# Patient Record
Sex: Female | Born: 1980 | Hispanic: No | Marital: Married | Smoking: Never smoker
Health system: Southern US, Community
[De-identification: ages and names within clinical notes are randomized; demographics above are authoritative.]

## PROBLEM LIST (undated history)

## (undated) ENCOUNTER — Inpatient Hospital Stay (HOSPITAL_COMMUNITY): Payer: Self-pay

## (undated) DIAGNOSIS — Z789 Other specified health status: Secondary | ICD-10-CM

## (undated) DIAGNOSIS — F99 Mental disorder, not otherwise specified: Secondary | ICD-10-CM

## (undated) DIAGNOSIS — K4020 Bilateral inguinal hernia, without obstruction or gangrene, not specified as recurrent: Secondary | ICD-10-CM

## (undated) HISTORY — PX: HERNIA REPAIR: SHX51

## (undated) HISTORY — PX: DRUG INDUCED ENDOSCOPY: SHX6808

---
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
## 2006-10-28 ENCOUNTER — Emergency Department (HOSPITAL_COMMUNITY): Admission: EM | Admit: 2006-10-28 | Discharge: 2006-10-28 | Payer: Self-pay | Admitting: Emergency Medicine

## 2006-10-29 ENCOUNTER — Inpatient Hospital Stay (HOSPITAL_COMMUNITY): Admission: AD | Admit: 2006-10-29 | Discharge: 2006-10-30 | Payer: Self-pay | Admitting: *Deleted

## 2006-10-29 ENCOUNTER — Ambulatory Visit: Payer: Self-pay | Admitting: *Deleted

## 2008-07-22 ENCOUNTER — Emergency Department (HOSPITAL_COMMUNITY): Admission: EM | Admit: 2008-07-22 | Discharge: 2008-07-22 | Payer: Self-pay | Admitting: Emergency Medicine

## 2010-06-25 LAB — CULTURE, ROUTINE-ABSCESS: Culture: NO GROWTH
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
## 2010-12-30 LAB — PREGNANCY, URINE: Preg Test, Ur: NEGATIVE

## 2010-12-30 LAB — COMPREHENSIVE METABOLIC PANEL
Albumin: 4.2
Alkaline Phosphatase: 46
BUN: 4 — ABNORMAL LOW
Potassium: 4.1
Sodium: 142
Total Protein: 7.2

## 2010-12-30 LAB — DIFFERENTIAL
Basophils Relative: 0
Monocytes Absolute: 0.3
Monocytes Relative: 5
Neutro Abs: 3.3

## 2010-12-30 LAB — RAPID URINE DRUG SCREEN, HOSP PERFORMED
Amphetamines: NOT DETECTED
Benzodiazepines: NOT DETECTED
Cocaine: NOT DETECTED
Tetrahydrocannabinol: NOT DETECTED

## 2010-12-30 LAB — CBC
HCT: 37.4
Platelets: 231
RDW: 12

## 2010-12-30 LAB — URINALYSIS, ROUTINE W REFLEX MICROSCOPIC
Bilirubin Urine: NEGATIVE
Ketones, ur: NEGATIVE
Nitrite: NEGATIVE
Protein, ur: NEGATIVE
Specific Gravity, Urine: 1.004 — ABNORMAL LOW
Urobilinogen, UA: 0.2

## 2010-12-30 LAB — ETHANOL: Alcohol, Ethyl (B): 48 — ABNORMAL HIGH

## 2011-03-18 NOTE — L&D Delivery Note (Signed)
Patient was C/C/+2 and pushed for approx 3 hrs with epidural.   NSVD female infant, Apgars 8/9, weight pending.   The patient had a second degree laceration extending superficially to anus, but not extending internally, repaired with 2-0 vicryl. Fundus was firm. EBL was expected. Placenta was delivered intact. Vagina was clear.  Baby was vigorous to bedside.  Philip Aspen

## 2011-07-18 LAB — OB RESULTS CONSOLE GC/CHLAMYDIA
Chlamydia: NEGATIVE
Gonorrhea: NEGATIVE

## 2012-01-30 ENCOUNTER — Inpatient Hospital Stay (HOSPITAL_COMMUNITY)
Admission: AD | Admit: 2012-01-30 | Discharge: 2012-02-01 | DRG: 775 | Disposition: A | Discharge status: Home / Self Care | Payer: 59 | Source: Ambulatory Visit | Attending: Obstetrics and Gynecology | Admitting: Obstetrics and Gynecology

## 2012-01-30 ENCOUNTER — Encounter (HOSPITAL_COMMUNITY): Payer: Self-pay | Admitting: Anesthesiology

## 2012-01-30 ENCOUNTER — Encounter (HOSPITAL_COMMUNITY): Payer: Self-pay | Admitting: *Deleted

## 2012-01-30 ENCOUNTER — Inpatient Hospital Stay (HOSPITAL_COMMUNITY): Payer: 59 | Admitting: Anesthesiology

## 2012-01-30 HISTORY — DX: Other specified health status: Z78.9

## 2012-01-30 HISTORY — DX: Mental disorder, not otherwise specified: F99

## 2012-01-30 LAB — OB RESULTS CONSOLE ABO/RH: RH Type: POSITIVE

## 2012-01-30 LAB — CBC
HCT: 40.2 % (ref 36.0–46.0)
Hemoglobin: 14.2 g/dL (ref 12.0–15.0)
MCV: 96.6 fL (ref 78.0–100.0)
RBC: 4.16 MIL/uL (ref 3.87–5.11)
RDW: 13.3 % (ref 11.5–15.5)
WBC: 15.3 10*3/uL — ABNORMAL HIGH (ref 4.0–10.5)

## 2012-01-30 LAB — OB RESULTS CONSOLE ANTIBODY SCREEN: Antibody Screen: NEGATIVE

## 2012-01-30 LAB — OB RESULTS CONSOLE HEPATITIS B SURFACE ANTIGEN: Hepatitis B Surface Ag: NEGATIVE

## 2012-01-30 MED ORDER — ACETAMINOPHEN 325 MG PO TABS
650.0000 mg | ORAL_TABLET | ORAL | Status: DC | PRN
  Administered 2012-01-30: 650 mg via ORAL
  Filled 2012-01-30: qty 2

## 2012-01-30 MED ORDER — LACTATED RINGERS IV SOLN
500.0000 mL | Freq: Once | INTRAVENOUS | Status: AC
  Administered 2012-01-30: 500 mL via INTRAVENOUS

## 2012-01-30 MED ORDER — LACTATED RINGERS IV SOLN
INTRAVENOUS | Status: DC
  Administered 2012-01-30 (×2): via INTRAVENOUS

## 2012-01-30 MED ORDER — OXYTOCIN 40 UNITS IN LACTATED RINGERS INFUSION - SIMPLE MED: 62.5000 mL/h | INTRAVENOUS | Status: DC

## 2012-01-30 MED ORDER — EPHEDRINE 5 MG/ML INJ: 10.0000 mg | INTRAVENOUS | Status: DC | PRN

## 2012-01-30 MED ORDER — PHENYLEPHRINE 40 MCG/ML (10ML) SYRINGE FOR IV PUSH (FOR BLOOD PRESSURE SUPPORT)
80.0000 ug | PREFILLED_SYRINGE | INTRAVENOUS | Status: DC | PRN
  Filled 2012-01-30: qty 5

## 2012-01-30 MED ORDER — DIPHENHYDRAMINE HCL 50 MG/ML IJ SOLN: 12.5000 mg | INTRAMUSCULAR | Status: DC | PRN

## 2012-01-30 MED ORDER — FLEET ENEMA 7-19 GM/118ML RE ENEM: 1.0000 | ENEMA | RECTAL | Status: DC | PRN

## 2012-01-30 MED ORDER — TERBUTALINE SULFATE 1 MG/ML IJ SOLN: 0.2500 mg | Freq: Once | INTRAMUSCULAR | Status: AC | PRN

## 2012-01-30 MED ORDER — ONDANSETRON HCL 4 MG/2ML IJ SOLN: 4.0000 mg | Freq: Four times a day (QID) | INTRAMUSCULAR | Status: DC | PRN

## 2012-01-30 MED ORDER — LIDOCAINE HCL (PF) 1 % IJ SOLN
30.0000 mL | INTRAMUSCULAR | Status: DC | PRN
  Filled 2012-01-30: qty 30

## 2012-01-30 MED ORDER — OXYCODONE-ACETAMINOPHEN 5-325 MG PO TABS: 1.0000 | ORAL_TABLET | ORAL | Status: DC | PRN

## 2012-01-30 MED ORDER — OXYTOCIN 40 UNITS IN LACTATED RINGERS INFUSION - SIMPLE MED
1.0000 m[IU]/min | INTRAVENOUS | Status: DC
  Administered 2012-01-30: 2 m[IU]/min via INTRAVENOUS
  Filled 2012-01-30: qty 1000

## 2012-01-30 MED ORDER — FENTANYL 2.5 MCG/ML BUPIVACAINE 1/10 % EPIDURAL INFUSION (WH - ANES)
14.0000 mL/h | INTRAMUSCULAR | Status: DC
  Administered 2012-01-30: 14 mL/h via EPIDURAL
  Filled 2012-01-30 (×2): qty 125

## 2012-01-30 MED ORDER — OXYTOCIN BOLUS FROM INFUSION
500.0000 mL | INTRAVENOUS | Status: DC
  Administered 2012-01-31: 500 mL via INTRAVENOUS

## 2012-01-30 MED ORDER — LACTATED RINGERS IV SOLN: 500.0000 mL | INTRAVENOUS | Status: DC | PRN

## 2012-01-30 MED ORDER — PHENYLEPHRINE 40 MCG/ML (10ML) SYRINGE FOR IV PUSH (FOR BLOOD PRESSURE SUPPORT): 80.0000 ug | PREFILLED_SYRINGE | INTRAVENOUS | Status: DC | PRN

## 2012-01-30 MED ORDER — LIDOCAINE HCL (PF) 1 % IJ SOLN
INTRAMUSCULAR | Status: DC | PRN
  Administered 2012-01-30 (×2): 5 mL
  Administered 2012-01-31: 30 mL

## 2012-01-30 MED ORDER — CITRIC ACID-SODIUM CITRATE 334-500 MG/5ML PO SOLN: 30.0000 mL | ORAL | Status: DC | PRN

## 2012-01-30 MED ORDER — IBUPROFEN 600 MG PO TABS: 600.0000 mg | ORAL_TABLET | Freq: Four times a day (QID) | ORAL | Status: DC | PRN

## 2012-01-30 MED ORDER — EPHEDRINE 5 MG/ML INJ
10.0000 mg | INTRAVENOUS | Status: DC | PRN
  Filled 2012-01-30: qty 4

## 2012-01-30 NOTE — H&P (Signed)
31 y.o. [redacted]w[redacted]d  G2P0010 comes in c/o ctx, while being monitored pt grossly ruptures, meconium noted.  Otherwise has good fetal movement and no bleeding.  Past Medical History  Diagnosis Date  . No pertinent past medical history   . Mental disorder     pt hospitalized @ [facility]; cutter    Past Surgical History  Procedure Date  . Hernia repair     OB History    Grav Para Term Preterm Abortions TAB SAB Ect Mult Living   2 0 0 0 1 1 0 0 0 0      # Outc Date GA Lbr Len/2nd Wgt Sex Del Anes PTL Lv   1 TAB            2 CUR               History   Social History  . Marital Status: Married    Spouse Name: N/A    Number of Children: N/A  . Years of Education: N/A   Occupational History  . Not on file.   Social History Main Topics  . Smoking status: Never Smoker   . Smokeless tobacco: Not on file  . Alcohol Use: No  . Drug Use: No  . Sexually Active: Not Currently   Other Topics Concern  . Not on file   Social History Narrative  . No narrative on file   Review of patient's allergies indicates no known allergies.    Prenatal Transfer Tool  Maternal Diabetes: No Genetic Screening: Normal Maternal Ultrasounds/Referrals: Normal Fetal Ultrasounds or other Referrals:  None Maternal Substance Abuse:  No Significant Maternal Medications:  None Significant Maternal Lab Results: None  Other PNC:    Filed Vitals:   01/30/12 1601  BP: 112/69  Pulse: 91  Temp: 98.2 F (36.8 C)  Resp: 22     Lungs/Cor:  NAD Abdomen:  soft, gravid Ex:  no cords, erythema SVE:  2/90/-2 at admission, now 5/90/-1 FHTs:  135, good STV, NST R Toco:  q1-5   A/P  Admit with SROM  GBS Neg Pitocin 2x2 if ctx space out epidural Ekaterina Denise

## 2012-01-30 NOTE — MAU Note (Signed)
Pt reports having ctx since 630 am about 4 min apart. Reports some pinkish discharge. Good fetal movement reported. Not dilated in office yesterday.

## 2012-01-30 NOTE — Anesthesia Preprocedure Evaluation (Signed)

## 2012-01-30 NOTE — Anesthesia Procedure Notes (Signed)
Epidural Patient location during procedure: OB Start time: 01/30/2012 2:17 PM  Staffing Anesthesiologist: Brayton Caves R Performed by: anesthesiologist   Preanesthetic Checklist Completed: patient identified, site marked, surgical consent, pre-op evaluation, timeout performed, IV checked, risks and benefits discussed and monitors and equipment checked  Epidural Patient position: sitting Prep: site prepped and draped and DuraPrep Patient monitoring: continuous pulse ox and blood pressure Approach: midline Injection technique: LOR air and LOR saline  Needle:  Needle type: Tuohy  Needle gauge: 17 G Needle length: 9 cm and 9 Needle insertion depth: 5 cm cm Catheter type: closed end flexible Catheter size: 19 Gauge Catheter at skin depth: 10 cm Test dose: negative  Assessment Events: blood not aspirated, injection not painful, no injection resistance, negative IV test and no paresthesia  Additional Notes Patient identified.  Risk benefits discussed including failed block, incomplete pain control, headache, nerve damage, paralysis, blood pressure changes, nausea, vomiting, reactions to medication both toxic or allergic, and postpartum back pain.  Patient expressed understanding and wished to proceed.  All questions were answered.  Sterile technique used throughout procedure and epidural site dressed with sterile barrier dressing. No paresthesia or other complications noted.The patient did not experience any signs of intravascular injection such as tinnitus or metallic taste in mouth nor signs of intrathecal spread such as rapid motor block. Please see nursing notes for vital signs.

## 2012-01-31 ENCOUNTER — Encounter (HOSPITAL_COMMUNITY): Payer: Self-pay | Admitting: *Deleted

## 2012-01-31 LAB — CBC
HCT: 33.8 % — ABNORMAL LOW (ref 36.0–46.0)
Hemoglobin: 12 g/dL (ref 12.0–15.0)
MCV: 97.4 fL (ref 78.0–100.0)
RBC: 3.47 MIL/uL — ABNORMAL LOW (ref 3.87–5.11)
WBC: 24.8 10*3/uL — ABNORMAL HIGH (ref 4.0–10.5)

## 2012-01-31 MED ORDER — DIPHENHYDRAMINE HCL 25 MG PO CAPS: 25.0000 mg | ORAL_CAPSULE | Freq: Four times a day (QID) | ORAL | Status: DC | PRN

## 2012-01-31 MED ORDER — ZOLPIDEM TARTRATE 5 MG PO TABS: 5.0000 mg | ORAL_TABLET | Freq: Every evening | ORAL | Status: DC | PRN

## 2012-01-31 MED ORDER — TETANUS-DIPHTH-ACELL PERTUSSIS 5-2.5-18.5 LF-MCG/0.5 IM SUSP
0.5000 mL | Freq: Once | INTRAMUSCULAR | Status: AC
  Administered 2012-02-01: 0.5 mL via INTRAMUSCULAR
  Filled 2012-01-31: qty 0.5

## 2012-01-31 MED ORDER — IBUPROFEN 600 MG PO TABS
600.0000 mg | ORAL_TABLET | Freq: Four times a day (QID) | ORAL | Status: DC
  Administered 2012-01-31 – 2012-02-01 (×6): 600 mg via ORAL
  Filled 2012-01-31 (×6): qty 1

## 2012-01-31 MED ORDER — SENNOSIDES-DOCUSATE SODIUM 8.6-50 MG PO TABS
2.0000 | ORAL_TABLET | Freq: Every day | ORAL | Status: DC
  Administered 2012-01-31: 2 via ORAL

## 2012-01-31 MED ORDER — ONDANSETRON HCL 4 MG PO TABS: 4.0000 mg | ORAL_TABLET | ORAL | Status: DC | PRN

## 2012-01-31 MED ORDER — PRENATAL MULTIVITAMIN CH
1.0000 | ORAL_TABLET | Freq: Every day | ORAL | Status: DC
  Administered 2012-01-31 – 2012-02-01 (×2): 1 via ORAL
  Filled 2012-01-31 (×2): qty 1

## 2012-01-31 MED ORDER — ONDANSETRON HCL 4 MG/2ML IJ SOLN: 4.0000 mg | INTRAMUSCULAR | Status: DC | PRN

## 2012-01-31 MED ORDER — PRENATAL MULTIVITAMIN CH: 1.0000 | ORAL_TABLET | Freq: Every day | ORAL | Status: DC

## 2012-01-31 MED ORDER — DIBUCAINE 1 % RE OINT: 1.0000 "application " | TOPICAL_OINTMENT | RECTAL | Status: DC | PRN

## 2012-01-31 MED ORDER — LANOLIN HYDROUS EX OINT: TOPICAL_OINTMENT | CUTANEOUS | Status: DC | PRN

## 2012-01-31 MED ORDER — BENZOCAINE-MENTHOL 20-0.5 % EX AERO
1.0000 "application " | INHALATION_SPRAY | CUTANEOUS | Status: DC | PRN
  Administered 2012-01-31 – 2012-02-01 (×2): 1 via TOPICAL
  Filled 2012-01-31 (×2): qty 56

## 2012-01-31 MED ORDER — SIMETHICONE 80 MG PO CHEW: 80.0000 mg | CHEWABLE_TABLET | ORAL | Status: DC | PRN

## 2012-01-31 MED ORDER — WITCH HAZEL-GLYCERIN EX PADS: 1.0000 "application " | MEDICATED_PAD | CUTANEOUS | Status: DC | PRN

## 2012-01-31 MED ORDER — OXYCODONE-ACETAMINOPHEN 5-325 MG PO TABS: 1.0000 | ORAL_TABLET | ORAL | Status: DC | PRN

## 2012-01-31 NOTE — Anesthesia Postprocedure Evaluation (Signed)
  Anesthesia Post-op Note  Patient: Vanessa Reed  Procedure(s) Performed: * No procedures listed *  Patient Location: Mother/Baby  Anesthesia Type:Epidural  Level of Consciousness: awake, alert  and oriented  Airway and Oxygen Therapy: Patient Spontanous Breathing  Post-op Pain: mild  Post-op Assessment: Patient's Cardiovascular Status Stable, Respiratory Function Stable, Patent Airway, No signs of Nausea or vomiting and Pain level controlled  Post-op Vital Signs: stable  Complications: No apparent anesthesia complications

## 2012-01-31 NOTE — Progress Notes (Signed)
Dr Claiborne Billings updated on UC pattern, FHR, and pushing.  Orders to increase pit per protocol and continue pushing.

## 2012-01-31 NOTE — Progress Notes (Signed)
Patient is eating, ambulating, voiding.  Pain control is good.  Appropriate lochia.  No complaints.  Filed Vitals:   01/31/12 0346 01/31/12 0431 01/31/12 0530 01/31/12 0920  BP: 94/61 95/61 94/60  98/62  Pulse: 114 111 106 90  Temp:  98.3 F (36.8 C) 98.6 F (37 C) 98.1 F (36.7 C)  TempSrc:  Axillary Axillary Oral  Resp: 18 18 18 18   Height:      Weight:      SpO2:  98% 96%     Fundus firm No CT  Lab Results  Component Value Date   WBC 24.8* 01/31/2012   HGB 12.0 01/31/2012   HCT 33.8* 01/31/2012   MCV 97.4 01/31/2012   PLT 183 01/31/2012    --/--/O POS (11/15 1335)  A/P Post partum day 0 - delivered ~3am.  Routine care.   Consented for circ - tomorrow.   Philip Aspen

## 2012-01-31 NOTE — Clinical Social Work Note (Signed)
Clinical Social Work Department PSYCHOSOCIAL ASSESSMENT - MATERNAL/CHILD 01/31/2012  Patient:  KHADEEJA, ELDEN  Account Number:  [id number]  Admit Date:  01/30/2012  Marjo Bicker Name:   Glory Rosebush    Clinical Social Worker:  Truman Hayward, LCSW   Date/Time:  01/31/2012 10:00 AM  Date Referred:  01/31/2012   Referral source  Physician  RN     Referred reason  Behavioral Health Issues   Other referral source:    I:  FAMILY / HOME ENVIRONMENT Child's legal guardian:  PARENT  Guardian - Name Guardian - Age Guardian - Address  Jalaya Sarver 31 [address] [place] [address]  [place]ene Lot  [address] [place] [address]   Other household support members/support persons Name Relationship DOB  none     Other support:   MOB and FOB report good family support on both sides    II  PSYCHOSOCIAL DATA Information Source:  Patient Interview  Event organiser Employment:   Surveyor, quantity resources:  Media planner If [employer] - [place]:    School / Grade:   Maternity Care Coordinator / Child Services Coordination / Early Interventions:  Cultural issues impacting care:    III  STRENGTHS Strengths  Adequate Resources  Home prepared for Child (including basic supplies)  Compliance with medical plan  Supportive family/friends   Strength comment:    IV  RISK FACTORS AND CURRENT PROBLEMS Current Problem:  None   Risk Factor & Current Problem Patient Issue Family Issue Risk Factor / Current Problem Comment   N N     V  SOCIAL WORK ASSESSMENT CSW spoke with MOB and FOB in room.  MOB reports no current emotional concerns.  CSW discussed symptoms with MOB of PPD.  MOB reports she is aware of symptoms to look out for. CSW discussed mental health hx.  MOB reports inpt stay in 2008, however no concerns since then.  MOB was in counseling for 6 months after inpt stay and completed treatment.  CSW discussed supplies and family support.  MOB and FOB  report no concerns and know to let RN or CSW know if any concerns arise.  No hx of SA and no current concerns.  Please reconsult CSW if further needs arise.      VI SOCIAL WORK PLAN Social Work Plan  No Further Intervention Required / No Barriers to Discharge   Type of pt/family education:   If child protective services report - county:   If child protective services report - date:   Information/referral to community resources comment:   Other social work plan:

## 2012-01-31 NOTE — Progress Notes (Signed)
Dr Claiborne Billings at bedside gave verbal order to increase pit q 15 min.

## 2012-02-01 NOTE — Discharge Summary (Signed)
Obstetric Discharge Summary Reason for Admission: onset of labor Prenatal Procedures: none Intrapartum Procedures: spontaneous vaginal delivery Postpartum Procedures: none Complications-Operative and Postpartum: 2nd degree perineal laceration Hemoglobin  Date Value Range Status  01/31/2012 12.0  12.0 - 15.0 g/dL Final     HCT  Date Value Range Status  01/31/2012 33.8* 36.0 - 46.0 % Final    Physical Exam:  General: alert and cooperative Lochia: appropriate Uterine Fundus: firm DVT Evaluation: No evidence of DVT seen on physical exam.  Discharge Diagnoses: Term Pregnancy-delivered  Discharge Information: Date: 02/01/2012 Activity: pelvic rest Diet: routine Medications: PNV, Ibuprofen and Colace Condition: stable Instructions: refer to practice specific booklet Discharge to: home Follow-up Information    Follow up with Vanessa Kayes, DO. In 4 weeks.   Contact information:   [address] [place] [address] [phone]          Newborn Data: Live born female  Birth Weight: 8 lb 11.7 oz (3960 g) APGAR: 8, 9  Home with mother.  Philip Aspen 02/01/2012, 11:10 AM

## 2012-02-03 NOTE — Progress Notes (Signed)
Post discharge chart review completed.  

## 2013-08-15 ENCOUNTER — Other Ambulatory Visit: Payer: Self-pay

## 2013-11-15 ENCOUNTER — Other Ambulatory Visit: Payer: Self-pay

## 2013-12-13 LAB — OB RESULTS CONSOLE HIV ANTIBODY (ROUTINE TESTING): HIV: NONREACTIVE

## 2013-12-13 LAB — OB RESULTS CONSOLE ABO/RH: RH Type: POSITIVE

## 2013-12-13 LAB — OB RESULTS CONSOLE HEPATITIS B SURFACE ANTIGEN: Hepatitis B Surface Ag: NEGATIVE

## 2013-12-13 LAB — OB RESULTS CONSOLE RPR: RPR: NONREACTIVE

## 2013-12-13 LAB — OB RESULTS CONSOLE GC/CHLAMYDIA
Chlamydia: NEGATIVE
GC PROBE AMP, GENITAL: NEGATIVE

## 2013-12-13 LAB — OB RESULTS CONSOLE RUBELLA ANTIBODY, IGM: RUBELLA: IMMUNE

## 2013-12-13 LAB — OB RESULTS CONSOLE ANTIBODY SCREEN: Antibody Screen: NEGATIVE

## 2014-01-16 ENCOUNTER — Encounter (HOSPITAL_COMMUNITY): Payer: Self-pay | Admitting: *Deleted

## 2014-03-17 NOTE — L&D Delivery Note (Signed)
Delivery Note At 8:04 AM a viable female was delivered via Vaginal, Spontaneous Delivery (Presentation:  Occiput Anterior  ).  APGAR: 9, 9; weight  .   Placenta status: Intact, Spontaneous.  Cord: 3 vessels with the following complications: None.  Cord pH: n/a  Anesthesia: Epidural  Episiotomy: None Lacerations: 2nd degree Suture Repair: 2.0 vicryl rapide Est. Blood Loss (mL): 500  Mom to postpartum.  Baby to Couplet care / Skin to Skin.  Praise Stennett GEFFEL Blue Winther 06/19/2014, 9:04 AM

## 2014-05-05 ENCOUNTER — Encounter (HOSPITAL_COMMUNITY): Payer: Self-pay | Admitting: *Deleted

## 2014-05-05 ENCOUNTER — Inpatient Hospital Stay (HOSPITAL_COMMUNITY)
Admission: AD | Admit: 2014-05-05 | Discharge: 2014-05-05 | Disposition: A | Discharge status: Home / Self Care | Payer: 59 | Source: Ambulatory Visit | Attending: Obstetrics and Gynecology | Admitting: Obstetrics and Gynecology

## 2014-05-05 DIAGNOSIS — O99343 Other mental disorders complicating pregnancy, third trimester: Secondary | ICD-10-CM | POA: Insufficient documentation

## 2014-05-05 DIAGNOSIS — F419 Anxiety disorder, unspecified: Secondary | ICD-10-CM | POA: Diagnosis not present

## 2014-05-05 DIAGNOSIS — Z3A31 31 weeks gestation of pregnancy: Secondary | ICD-10-CM | POA: Insufficient documentation

## 2014-05-05 DIAGNOSIS — Z3490 Encounter for supervision of normal pregnancy, unspecified, unspecified trimester: Secondary | ICD-10-CM

## 2014-05-05 DIAGNOSIS — M549 Dorsalgia, unspecified: Secondary | ICD-10-CM | POA: Diagnosis present

## 2014-05-05 HISTORY — DX: Bilateral inguinal hernia, without obstruction or gangrene, not specified as recurrent: K40.20

## 2014-05-05 LAB — COMPREHENSIVE METABOLIC PANEL
ALBUMIN: 3 g/dL — AB (ref 3.5–5.2)
ALK PHOS: 133 U/L — AB (ref 39–117)
ALT: 17 U/L (ref 0–35)
ANION GAP: 5 (ref 5–15)
AST: 22 U/L (ref 0–37)
BUN: 5 mg/dL — ABNORMAL LOW (ref 6–23)
CALCIUM: 8.3 mg/dL — AB (ref 8.4–10.5)
CO2: 18 mmol/L — ABNORMAL LOW (ref 19–32)
CREATININE: 0.31 mg/dL — AB (ref 0.50–1.10)
Chloride: 112 mmol/L (ref 96–112)
GFR calc Af Amer: >90 mL/min (ref >90)
GFR calc non Af Amer: >90 mL/min (ref >90)
Glucose, Bld: 98 mg/dL (ref 70–99)
Potassium: 3.4 mmol/L — ABNORMAL LOW (ref 3.5–5.1)
SODIUM: 135 mmol/L (ref 135–145)
Total Bilirubin: 0.5 mg/dL (ref 0.3–1.2)
Total Protein: 6.2 g/dL (ref 6.0–8.3)

## 2014-05-05 LAB — URINALYSIS, ROUTINE W REFLEX MICROSCOPIC
BILIRUBIN URINE: NEGATIVE
Glucose, UA: 100 mg/dL — AB
Hgb urine dipstick: NEGATIVE
KETONES UR: NEGATIVE mg/dL
NITRITE: NEGATIVE
PH: 6 (ref 5.0–8.0)
Protein, ur: NEGATIVE mg/dL
Specific Gravity, Urine: <1.005 — ABNORMAL LOW (ref 1.005–1.030)
Urobilinogen, UA: 0.2 mg/dL (ref 0.0–1.0)

## 2014-05-05 LAB — CBC WITH DIFFERENTIAL/PLATELET
Basophils Absolute: 0 10*3/uL (ref 0.0–0.1)
Basophils Relative: 0 % (ref 0–1)
EOS ABS: 0.1 10*3/uL (ref 0.0–0.7)
Eosinophils Relative: 1 % (ref 0–5)
HCT: 36.2 % (ref 36.0–46.0)
Hemoglobin: 12.6 g/dL (ref 12.0–15.0)
LYMPHS PCT: 16 % (ref 12–46)
Lymphs Abs: 2.2 10*3/uL (ref 0.7–4.0)
MCH: 33.9 pg (ref 26.0–34.0)
MCHC: 34.8 g/dL (ref 30.0–36.0)
MCV: 97.3 fL (ref 78.0–100.0)
Monocytes Absolute: 1.2 10*3/uL — ABNORMAL HIGH (ref 0.1–1.0)
Monocytes Relative: 8 % (ref 3–12)
NEUTROS PCT: 75 % (ref 43–77)
Neutro Abs: 10.8 10*3/uL — ABNORMAL HIGH (ref 1.7–7.7)
PLATELETS: 205 10*3/uL (ref 150–400)
RBC: 3.72 MIL/uL — ABNORMAL LOW (ref 3.87–5.11)
RDW: 13.5 % (ref 11.5–15.5)
WBC: 14.3 10*3/uL — AB (ref 4.0–10.5)

## 2014-05-05 LAB — URINE MICROSCOPIC-ADD ON

## 2014-05-05 MED ORDER — HYDROXYZINE PAMOATE 25 MG PO CAPS: 25.0000 mg | ORAL_CAPSULE | Freq: Every evening | ORAL | Status: DC | PRN

## 2014-05-05 NOTE — MAU Note (Signed)
This thing has been going on for a couple wks.  Pressure starts in chest up to head, 'gets very red'. BP has been fine.  Also having pain - rt flank, comes and goes, just started today. No GI or GU complaints

## 2014-05-05 NOTE — MAU Provider Note (Signed)
History     CSN: [id number]  Arrival date and time: 05/05/14 1514   First Provider Initiated Contact with Patient 05/05/14 1624      Chief Complaint  Patient presents with  . Back Pain   HPI Comments: Vanessa Reed 34 y.o. G3P1011 [redacted]w[redacted]d presents to MAu with sensation of pressure that goes from her stomach to head about 2 x day leaving her with mild headache that resolves quickly. This has been ongoing for about 2 weeks. She also complains of right flank pains and states the baby favors that side. Denies any sx UTI. Denies any issues with pregnancy, no LOF, no contractions, no bleeding. She does admit to having a very high stress job and small child at home.   Back Pain      Past Medical History  Diagnosis Date  . No pertinent past medical history   . Mental disorder     pt hospitalized @ [facility]; cutter  . Hernia, inguinal, bilateral     Past Surgical History  Procedure Laterality Date  . Hernia repair      Family History  Problem Relation Age of Onset  . Hypertension Father   . Hypertension Paternal Aunt   . Heart disease Paternal Grandfather     History  Substance Use Topics  . Smoking status: Never Smoker   . Smokeless tobacco: Not on file  . Alcohol Use: No    Allergies: No Known Allergies  Prescriptions prior to admission  Medication Sig Dispense Refill Last Dose  . acetaminophen (TYLENOL) 500 MG tablet Take 1,000 mg by mouth every 6 (six) hours as needed. Takes for pain   Past Week at Unknown time  . loratadine (CLARITIN) 10 MG tablet Take 10 mg by mouth daily as needed for allergies.   Past Week at Unknown time  . Prenatal Vit-Fe Fumarate-FA (PRENATAL MULTIVITAMIN) TABS Take 1 tablet by mouth daily.   05/04/2014 at Unknown time    Review of Systems  Constitutional: Negative.        Sensation described in HPI  HENT: Negative.   Eyes: Negative.   Respiratory: Negative.   Cardiovascular: Negative.   Gastrointestinal: Negative.   Genitourinary:  Negative.   Musculoskeletal: Positive for back pain.  Skin: Negative.   Neurological: Negative.   Psychiatric/Behavioral: Negative.    Physical Exam   Blood pressure 106/66, pulse 94, temperature 98.5 F (36.9 C), temperature source Oral, resp. rate 16, weight 69.4 kg (153 lb), SpO2 100 %, unknown if currently breastfeeding.  Physical Exam  Constitutional: She is oriented to person, place, and time. She appears well-developed and well-nourished. No distress.  O2 sat 100 %  HENT:  Head: Normocephalic and atraumatic.  Eyes: Pupils are equal, round, and reactive to light.  Cardiovascular: Normal rate, regular rhythm and normal heart sounds.   Respiratory: Effort normal and breath sounds normal. No respiratory distress. She has no wheezes. She has no rales.  GI: Soft. Bowel sounds are normal. She exhibits no distension. There is no tenderness. There is no rebound.  Musculoskeletal: Normal range of motion.  Neurological: She is alert and oriented to person, place, and time. No cranial nerve deficit. Coordination normal.  Skin: Skin is warm and dry. No rash noted. No erythema. No pallor.  Well healed scars on forearms from previous cutting   EKG Sinus tachycardia   Results for orders placed or performed during the hospital encounter of 05/05/14 (from the past 24 hour(s))  Urinalysis, Routine w reflex microscopic  Status: Abnormal   Collection Time: 05/05/14  3:35 PM  Result Value Ref Range   Color, Urine YELLOW YELLOW   APPearance HAZY (A) CLEAR   Specific Gravity, Urine <1.005 (L) 1.005 - 1.030   pH 6.0 5.0 - 8.0   Glucose, UA 100 (A) NEGATIVE mg/dL   Hgb urine dipstick NEGATIVE NEGATIVE   Bilirubin Urine NEGATIVE NEGATIVE   Ketones, ur NEGATIVE NEGATIVE mg/dL   Protein, ur NEGATIVE NEGATIVE mg/dL   Urobilinogen, UA 0.2 0.0 - 1.0 mg/dL   Nitrite NEGATIVE NEGATIVE   Leukocytes, UA SMALL (A) NEGATIVE  Urine microscopic-add on     Status: Abnormal   Collection Time: 05/05/14   3:35 PM  Result Value Ref Range   Squamous Epithelial / LPF MANY (A) RARE   WBC, UA 3-6 <3 WBC/hpf   RBC / HPF 0-2 <3 RBC/hpf   Bacteria, UA MANY (A) RARE  CBC with Differential     Status: Abnormal   Collection Time: 05/05/14  5:00 PM  Result Value Ref Range   WBC 14.3 (H) 4.0 - 10.5 K/uL   RBC 3.72 (L) 3.87 - 5.11 MIL/uL   Hemoglobin 12.6 12.0 - 15.0 g/dL   HCT 21.336.2 08.636.0 - 57.846.0 %   MCV 97.3 78.0 - 100.0 fL   MCH 33.9 26.0 - 34.0 pg   MCHC 34.8 30.0 - 36.0 g/dL   RDW 46.913.5 62.911.5 - 52.815.5 %   Platelets 205 150 - 400 K/uL   Neutrophils Relative % 75 43 - 77 %   Neutro Abs 10.8 (H) 1.7 - 7.7 K/uL   Lymphocytes Relative 16 12 - 46 %   Lymphs Abs 2.2 0.7 - 4.0 K/uL   Monocytes Relative 8 3 - 12 %   Monocytes Absolute 1.2 (H) 0.1 - 1.0 K/uL   Eosinophils Relative 1 0 - 5 %   Eosinophils Absolute 0.1 0.0 - 0.7 K/uL   Basophils Relative 0 0 - 1 %   Basophils Absolute 0.0 0.0 - 0.1 K/uL  Comprehensive metabolic panel     Status: Abnormal   Collection Time: 05/05/14  5:00 PM  Result Value Ref Range   Sodium 135 135 - 145 mmol/L   Potassium 3.4 (L) 3.5 - 5.1 mmol/L   Chloride 112 96 - 112 mmol/L   CO2 18 (L) 19 - 32 mmol/L   Glucose, Bld 98 70 - 99 mg/dL   BUN 5 (L) 6 - 23 mg/dL   Creatinine, Ser 4.130.31 (L) 0.50 - 1.10 mg/dL   Calcium 8.3 (L) 8.4 - 10.5 mg/dL   Total Protein 6.2 6.0 - 8.3 g/dL   Albumin 3.0 (L) 3.5 - 5.2 g/dL   AST 22 0 - 37 U/L   ALT 17 0 - 35 U/L   Alkaline Phosphatase 133 (H) 39 - 117 U/L   Total Bilirubin 0.5 0.3 - 1.2 mg/dL   GFR calc non Af Amer >90 >90 mL/min   GFR calc Af Amer >90 >90 mL/min   Anion gap 5 5 - 15    MAU Course  Procedures  MDM  Spoke with Dr Claiborne Billingsallahan who advised CBC, CMET, UA, EKG, Urine culture Reviewed labs with patient She feels this maybe related to stress at work Assessment and Plan   A: Anxiety  P: Above labs/ EKG Will give Vistaril 25 mg po qhs prn Advised to follow up with Dr Callahan/ return to MAU as needed    Carolynn ServeBarefoot, Twinkle Sockwell Miller 05/05/2014, 4:40 PM

## 2014-05-05 NOTE — Discharge Instructions (Signed)
Generalized Anxiety Disorder Generalized anxiety disorder (GAD) is a mental disorder. It interferes with life functions, including relationships, work, and school. GAD is different from normal anxiety, which everyone experiences at some point in their lives in response to specific life events and activities. Normal anxiety actually helps us prepare for and get through these life events and activities. Normal anxiety goes away after the event or activity is over.  GAD causes anxiety that is not necessarily related to specific events or activities. It also causes excess anxiety in proportion to specific events or activities. The anxiety associated with GAD is also difficult to control. GAD can vary from mild to severe. People with severe GAD can have intense waves of anxiety with physical symptoms (panic attacks).  SYMPTOMS The anxiety and worry associated with GAD are difficult to control. This anxiety and worry are related to many life events and activities and also occur more days than not for 6 months or longer. People with GAD also have three or more of the following symptoms (one or more in children):  Restlessness.   Fatigue.  Difficulty concentrating.   Irritability.  Muscle tension.  Difficulty sleeping or unsatisfying sleep. DIAGNOSIS GAD is diagnosed through an assessment by your health care provider. Your health care provider will ask you questions aboutyour mood,physical symptoms, and events in your life. Your health care provider may ask you about your medical history and use of alcohol or drugs, including prescription medicines. Your health care provider may also do a physical exam and blood tests. Certain medical conditions and the use of certain substances can cause symptoms similar to those associated with GAD. Your health care provider may refer you to a mental health specialist for further evaluation. TREATMENT The following therapies are usually used to treat GAD:    Medication. Antidepressant medication usually is prescribed for long-term daily control. Antianxiety medicines may be added in severe cases, especially when panic attacks occur.   Talk therapy (psychotherapy). Certain types of talk therapy can be helpful in treating GAD by providing support, education, and guidance. A form of talk therapy called cognitive behavioral therapy can teach you healthy ways to think about and react to daily life events and activities.  Stress managementtechniques. These include yoga, meditation, and exercise and can be very helpful when they are practiced regularly. A mental health specialist can help determine which treatment is best for you. Some people see improvement with one therapy. However, other people require a combination of therapies. Document Released: 06/28/2012 Document Revised: 07/18/2013 Document Reviewed: 06/28/2012 ExitCare Patient Information 2015 [employer]. This information is not intended to replace advice given to you by your health care provider. Make sure you discuss any questions you have with your health care provider.  

## 2014-05-07 LAB — CULTURE, OB URINE
Colony Count: 55000
Special Requests: NORMAL

## 2014-06-08 ENCOUNTER — Other Ambulatory Visit: Payer: Self-pay | Admitting: Obstetrics and Gynecology

## 2014-06-08 LAB — OB RESULTS CONSOLE GBS: STREP GROUP B AG: POSITIVE

## 2014-06-19 ENCOUNTER — Inpatient Hospital Stay (HOSPITAL_COMMUNITY): Payer: 59 | Admitting: Anesthesiology

## 2014-06-19 ENCOUNTER — Inpatient Hospital Stay (HOSPITAL_COMMUNITY)
Admission: AD | Admit: 2014-06-19 | Discharge: 2014-06-20 | DRG: 775 | Disposition: A | Discharge status: Home / Self Care | Payer: 59 | Source: Ambulatory Visit | Attending: Obstetrics and Gynecology | Admitting: Obstetrics and Gynecology

## 2014-06-19 ENCOUNTER — Encounter (HOSPITAL_COMMUNITY): Payer: Self-pay | Admitting: *Deleted

## 2014-06-19 DIAGNOSIS — Z3483 Encounter for supervision of other normal pregnancy, third trimester: Secondary | ICD-10-CM | POA: Diagnosis present

## 2014-06-19 DIAGNOSIS — Z3A38 38 weeks gestation of pregnancy: Secondary | ICD-10-CM | POA: Diagnosis present

## 2014-06-19 DIAGNOSIS — O99824 Streptococcus B carrier state complicating childbirth: Secondary | ICD-10-CM | POA: Diagnosis present

## 2014-06-19 LAB — TYPE AND SCREEN
ABO/RH(D): O POS
Antibody Screen: NEGATIVE

## 2014-06-19 LAB — CBC
HEMATOCRIT: 40.4 % (ref 36.0–46.0)
Hemoglobin: 13.8 g/dL (ref 12.0–15.0)
MCH: 33.5 pg (ref 26.0–34.0)
MCHC: 34.2 g/dL (ref 30.0–36.0)
MCV: 98.1 fL (ref 78.0–100.0)
PLATELETS: 185 10*3/uL (ref 150–400)
RBC: 4.12 MIL/uL (ref 3.87–5.11)
RDW: 13.8 % (ref 11.5–15.5)
WBC: 12.6 10*3/uL — AB (ref 4.0–10.5)

## 2014-06-19 LAB — RPR: RPR Ser Ql: NONREACTIVE

## 2014-06-19 MED ORDER — LACTATED RINGERS IV SOLN
500.0000 mL | INTRAVENOUS | Status: DC | PRN
  Administered 2014-06-19: 1000 mL via INTRAVENOUS

## 2014-06-19 MED ORDER — OXYCODONE-ACETAMINOPHEN 5-325 MG PO TABS: 2.0000 | ORAL_TABLET | ORAL | Status: DC | PRN

## 2014-06-19 MED ORDER — TETANUS-DIPHTH-ACELL PERTUSSIS 5-2.5-18.5 LF-MCG/0.5 IM SUSP: 0.5000 mL | Freq: Once | INTRAMUSCULAR | Status: DC

## 2014-06-19 MED ORDER — IBUPROFEN 600 MG PO TABS
600.0000 mg | ORAL_TABLET | Freq: Four times a day (QID) | ORAL | Status: DC
  Administered 2014-06-19 – 2014-06-20 (×4): 600 mg via ORAL
  Filled 2014-06-19 (×5): qty 1

## 2014-06-19 MED ORDER — ONDANSETRON HCL 4 MG/2ML IJ SOLN: 4.0000 mg | Freq: Four times a day (QID) | INTRAMUSCULAR | Status: DC | PRN

## 2014-06-19 MED ORDER — FENTANYL 2.5 MCG/ML BUPIVACAINE 1/10 % EPIDURAL INFUSION (WH - ANES)
14.0000 mL/h | INTRAMUSCULAR | Status: DC | PRN
  Administered 2014-06-19: 14 mL/h via EPIDURAL
  Filled 2014-06-19: qty 125

## 2014-06-19 MED ORDER — FENTANYL 2.5 MCG/ML BUPIVACAINE 1/10 % EPIDURAL INFUSION (WH - ANES)
INTRAMUSCULAR | Status: DC | PRN
  Administered 2014-06-19: 14 mL/h via EPIDURAL

## 2014-06-19 MED ORDER — PENICILLIN G POTASSIUM 5000000 UNITS IJ SOLR
2.5000 10*6.[IU] | INTRAMUSCULAR | Status: DC
  Filled 2014-06-19 (×2): qty 2.5

## 2014-06-19 MED ORDER — SIMETHICONE 80 MG PO CHEW: 80.0000 mg | CHEWABLE_TABLET | ORAL | Status: DC | PRN

## 2014-06-19 MED ORDER — CITRIC ACID-SODIUM CITRATE 334-500 MG/5ML PO SOLN: 30.0000 mL | ORAL | Status: DC | PRN

## 2014-06-19 MED ORDER — DEXTROSE 5 % IV SOLN
5.0000 10*6.[IU] | Freq: Once | INTRAVENOUS | Status: AC
  Administered 2014-06-19: 5 10*6.[IU] via INTRAVENOUS
  Filled 2014-06-19: qty 5

## 2014-06-19 MED ORDER — LANOLIN HYDROUS EX OINT: TOPICAL_OINTMENT | CUTANEOUS | Status: DC | PRN

## 2014-06-19 MED ORDER — ACETAMINOPHEN 325 MG PO TABS: 650.0000 mg | ORAL_TABLET | ORAL | Status: DC | PRN

## 2014-06-19 MED ORDER — SENNOSIDES-DOCUSATE SODIUM 8.6-50 MG PO TABS
2.0000 | ORAL_TABLET | ORAL | Status: DC
  Administered 2014-06-20: 2 via ORAL
  Filled 2014-06-19: qty 2

## 2014-06-19 MED ORDER — OXYCODONE-ACETAMINOPHEN 5-325 MG PO TABS
1.0000 | ORAL_TABLET | ORAL | Status: DC | PRN
Start: 2014-06-19 — End: 2014-06-20

## 2014-06-19 MED ORDER — PHENYLEPHRINE 40 MCG/ML (10ML) SYRINGE FOR IV PUSH (FOR BLOOD PRESSURE SUPPORT)
80.0000 ug | PREFILLED_SYRINGE | INTRAVENOUS | Status: DC | PRN
  Administered 2014-06-19 (×2): 80 ug via INTRAVENOUS
  Filled 2014-06-19: qty 20
  Filled 2014-06-19: qty 2

## 2014-06-19 MED ORDER — OXYTOCIN 40 UNITS IN LACTATED RINGERS INFUSION - SIMPLE MED
62.5000 mL/h | INTRAVENOUS | Status: DC
  Filled 2014-06-19 (×2): qty 1000

## 2014-06-19 MED ORDER — WITCH HAZEL-GLYCERIN EX PADS: 1.0000 "application " | MEDICATED_PAD | CUTANEOUS | Status: DC | PRN

## 2014-06-19 MED ORDER — PHENYLEPHRINE 40 MCG/ML (10ML) SYRINGE FOR IV PUSH (FOR BLOOD PRESSURE SUPPORT)
80.0000 ug | PREFILLED_SYRINGE | INTRAVENOUS | Status: DC | PRN
  Filled 2014-06-19: qty 2

## 2014-06-19 MED ORDER — LORATADINE 10 MG PO TABS: 10.0000 mg | ORAL_TABLET | Freq: Every day | ORAL | Status: DC | PRN

## 2014-06-19 MED ORDER — ONDANSETRON HCL 4 MG/2ML IJ SOLN: 4.0000 mg | INTRAMUSCULAR | Status: DC | PRN

## 2014-06-19 MED ORDER — OXYTOCIN BOLUS FROM INFUSION
500.0000 mL | INTRAVENOUS | Status: DC
  Administered 2014-06-19: 500 mL via INTRAVENOUS

## 2014-06-19 MED ORDER — BENZOCAINE-MENTHOL 20-0.5 % EX AERO
1.0000 "application " | INHALATION_SPRAY | CUTANEOUS | Status: DC | PRN
  Filled 2014-06-19: qty 56

## 2014-06-19 MED ORDER — LACTATED RINGERS IV SOLN: 500.0000 mL | Freq: Once | INTRAVENOUS | Status: DC

## 2014-06-19 MED ORDER — EPHEDRINE 5 MG/ML INJ
10.0000 mg | INTRAVENOUS | Status: DC | PRN
  Filled 2014-06-19: qty 2

## 2014-06-19 MED ORDER — LIDOCAINE HCL (PF) 1 % IJ SOLN
INTRAMUSCULAR | Status: DC | PRN
  Administered 2014-06-19: 5 mL
  Administered 2014-06-19: 3 mL
  Administered 2014-06-19: 5 mL

## 2014-06-19 MED ORDER — OXYCODONE-ACETAMINOPHEN 5-325 MG PO TABS: 1.0000 | ORAL_TABLET | ORAL | Status: DC | PRN

## 2014-06-19 MED ORDER — DIPHENHYDRAMINE HCL 25 MG PO CAPS: 25.0000 mg | ORAL_CAPSULE | Freq: Four times a day (QID) | ORAL | Status: DC | PRN

## 2014-06-19 MED ORDER — DIBUCAINE 1 % RE OINT: 1.0000 "application " | TOPICAL_OINTMENT | RECTAL | Status: DC | PRN

## 2014-06-19 MED ORDER — ONDANSETRON HCL 4 MG PO TABS: 4.0000 mg | ORAL_TABLET | ORAL | Status: DC | PRN

## 2014-06-19 MED ORDER — LACTATED RINGERS IV SOLN
INTRAVENOUS | Status: DC
  Administered 2014-06-19: 06:00:00 via INTRAVENOUS

## 2014-06-19 MED ORDER — FLEET ENEMA 7-19 GM/118ML RE ENEM: 1.0000 | ENEMA | RECTAL | Status: DC | PRN

## 2014-06-19 MED ORDER — DIPHENHYDRAMINE HCL 50 MG/ML IJ SOLN: 12.5000 mg | INTRAMUSCULAR | Status: DC | PRN

## 2014-06-19 MED ORDER — LIDOCAINE HCL (PF) 1 % IJ SOLN
30.0000 mL | INTRAMUSCULAR | Status: DC | PRN
  Administered 2014-06-19: 30 mL via SUBCUTANEOUS
  Filled 2014-06-19 (×2): qty 30

## 2014-06-19 MED ORDER — PRENATAL MULTIVITAMIN CH
1.0000 | ORAL_TABLET | Freq: Every day | ORAL | Status: DC
  Administered 2014-06-19: 1 via ORAL
  Filled 2014-06-19 (×2): qty 1

## 2014-06-19 NOTE — Progress Notes (Signed)
MOB was referred for history of depression/anxiety (admission to Behavioral Health, history of cutting)  Referral is screened out by Clinical Social Worker because none of the following criteria appear to apply: -History of anxiety/depression during this pregnancy, or of post-partum depression. - Diagnosis of anxiety and/or depression within last 3 years or -MOB's symptoms are currently being treated with medication and/or therapy.  CSW completed chart review.  CSW completed assessment with MOB in 2013 after her first child was born.  Assessment documented that MOB was hospitalized at [facility] in 2008, and then continued outpatient therapy/treatment for 6 months.  At that time, MOB denied any residual/chronic symptoms.  CSW reviewed prenatal records from this pregnancy, depression is no longer listed as a current/presenting problem.   Please contact the Clinical Social Worker if needs arise or upon MOB request.   Oluwaseyi Raffel, LCSW [phone] 

## 2014-06-19 NOTE — Anesthesia Postprocedure Evaluation (Signed)
  Anesthesia Post-op Note  Patient: Vanessa Reed  Procedure(s) Performed: * No procedures listed *  Patient Location: Mother/Baby  Anesthesia Type:Epidural  Level of Consciousness: awake, alert , oriented and patient cooperative  Airway and Oxygen Therapy: Patient Spontanous Breathing  Post-op Pain: none  Post-op Assessment: Post-op Vital signs reviewed, Patient's Cardiovascular Status Stable, Respiratory Function Stable, Patent Airway, No headache, No backache, No residual numbness and No residual motor weakness  Post-op Vital Signs: Reviewed and stable  Last Vitals:  Filed Vitals:   06/19/14 1040  BP: 107/64  Pulse: 98  Temp: 36.9 C  Resp: 18    Complications: No apparent anesthesia complications

## 2014-06-19 NOTE — Anesthesia Preprocedure Evaluation (Signed)

## 2014-06-19 NOTE — MAU Note (Signed)
Pt to be admitted with routine orders 

## 2014-06-19 NOTE — MAU Note (Signed)
Pt began contracting strong a few hours ago. Pt denies leaking of fluid and bleeding

## 2014-06-19 NOTE — H&P (Signed)
34 y.o. G3P1011 @ 5786w1d presents with labor.  Otherwise has good fetal movement and no bleeding.    Past Medical History  Diagnosis Date  . No pertinent past medical history   . Mental disorder     pt hospitalized @ [facility]; cutter  . Hernia, inguinal, bilateral     Past Surgical History  Procedure Laterality Date  . Hernia repair      OB History  Gravida Para Term Preterm AB SAB TAB Ectopic Multiple Living  3 1 1  0 1 0 1 0 0 1    # Outcome Date GA Lbr Len/2nd Weight Sex Delivery Anes PTL Lv  3 Current           2 Term 01/31/12 146w2d 16:45 / 03:01 3.96 kg (8 lb 11.7 oz) M Vag-Spont EPI  Y  1 TAB               History   Social History  . Marital Status: Married    Spouse Name: N/A  . Number of Children: N/A  . Years of Education: N/A   Occupational History  . Not on file.   Social History Main Topics  . Smoking status: Never Smoker   . Smokeless tobacco: Not on file  . Alcohol Use: No  . Drug Use: No  . Sexual Activity: Yes   Other Topics Concern  . Not on file   Social History Narrative   Review of patient's allergies indicates no known allergies.    Prenatal Transfer Tool  Maternal Diabetes: No Genetic Screening: Normal Maternal Ultrasounds/Referrals: Normal Fetal Ultrasounds or other Referrals:  None Maternal Substance Abuse:  No Significant Maternal Medications:  None Significant Maternal Lab Results: Lab values include: Group B Strep positive  ABO, Rh: --/--/O POS (04/04 0535) Antibody: NEG (04/04 0535) Rubella:  Immune RPR: Nonreactive (09/29 0000)  HBsAg: Negative (09/29 0000)  HIV: Non-reactive (09/29 0000)  GBS: Positive (03/24 0000)    Other PNC: uncomplicated.    Filed Vitals:   06/19/14 0831  BP: 108/57  Pulse: 88  Temp:   Resp:      General:  NAD Lungs: CTAB Cardiac: RRR Abdomen:  soft, gravid, EFW 7.5-8# Ex:  none edema SVE:  10/10/+2 FHTs:  130s, mod var, + accels, Cat 1 Toco:  q2-3 min   A/P   34 y.o. 2486w1d  G3P1011  presents with labor Progressed unaugmented to 10/10/+2, now w significant urge to push.  GBS positive: received first dose of abx, delivered prior to second dose.   [facility] Hospital[facility]ANNA GEFFEL [employer] [facility] [employer]CLARK

## 2014-06-19 NOTE — Lactation Note (Signed)
This note was copied from the chart of Vanessa Reed. Lactation Consultation Note  Patient Name: Vanessa Reed EXBMW'UToday's Date: 06/19/2014 Reason for consult: Initial assessment Baby 9 hours of life. Baby is being bathed by nurse technician when Vanessa Reed entered room. Mom states that she nurse her first child, but baby became dehydrated after discharged from hospital. Then mom started pumping and bottle-feeding EBM. Discussed with mom ways of knowing baby getting enough at breast. Mom states baby latching well. Enc mom to call for assistance with latching as needed. [facility] given [facility], aware of [facility], community resources, and [facility] assistance after [facility]. Mom aware that scales are available at [facility].  Maternal Data Has patient been taught Hand Expression?: Yes (Per mom.) Does the patient have breastfeeding experience prior to this delivery?: Yes  Feeding Feeding Type: Breast Fed Length of feed: 20 min  LATCH Score/Interventions Latch: Grasps breast easily, tongue down, lips flanged, rhythmical sucking.  Audible Swallowing: Spontaneous and intermittent Intervention(s): Hand expression  Type of Nipple: Everted at rest and after stimulation  Comfort (Breast/Nipple): Soft / non-tender     Hold (Positioning): No assistance needed to correctly position infant at breast.  LATCH Score: 10  Lactation Tools Discussed/Used     Consult Status Consult Status: Follow-up Date: 06/20/14 Follow-up type: In-patient    Vanessa Reed, Vanessa Reed 06/19/2014, 5:33 PM

## 2014-06-19 NOTE — Anesthesia Procedure Notes (Signed)
Epidural Patient location during procedure: OB  Staffing Anesthesiologist: Phillips GroutARIGNAN, Redford Behrle Performed by: anesthesiologist   Preanesthetic Checklist Completed: patient identified, site marked, surgical consent, pre-op evaluation, timeout performed, IV checked, risks and benefits discussed and monitors and equipment checked  Epidural Patient position: sitting Prep: DuraPrep Patient monitoring: heart rate, continuous pulse ox and blood pressure Approach: right paramedian Location: L3-L4 Injection technique: LOR saline  Needle:  Needle type: Tuohy  Needle gauge: 17 G Needle length: 9 cm and 9 Needle insertion depth: 4 cm Catheter type: closed end flexible Catheter size: 20 Guage Catheter at skin depth: 9 cm Test dose: negative  Assessment Events: blood not aspirated, injection not painful, no injection resistance, negative IV test and no paresthesia  Additional Notes   Patient tolerated the insertion well without complications.

## 2014-06-20 LAB — CBC
HCT: 36.5 % (ref 36.0–46.0)
Hemoglobin: 12.4 g/dL (ref 12.0–15.0)
MCH: 33.4 pg (ref 26.0–34.0)
MCHC: 34 g/dL (ref 30.0–36.0)
MCV: 98.4 fL (ref 78.0–100.0)
Platelets: 195 10*3/uL (ref 150–400)
RBC: 3.71 MIL/uL — ABNORMAL LOW (ref 3.87–5.11)
RDW: 14.2 % (ref 11.5–15.5)
WBC: 14.7 10*3/uL — AB (ref 4.0–10.5)

## 2014-06-20 MED ORDER — IBUPROFEN 600 MG PO TABS: 600.0000 mg | ORAL_TABLET | Freq: Four times a day (QID) | ORAL | Status: DC | PRN

## 2014-06-20 MED ORDER — OXYCODONE-ACETAMINOPHEN 5-325 MG PO TABS: 1.0000 | ORAL_TABLET | ORAL | Status: DC | PRN

## 2014-06-20 NOTE — Progress Notes (Signed)
Post Partum Day 1 Subjective: no complaints, up ad lib, voiding, tolerating PO, + flatus and breast feeding  Objective: Blood pressure 123/66, pulse 80, temperature 98 F (36.7 C), temperature source Oral, resp. rate 18, height 5' 3.5" (1.613 m), weight 72.576 kg (160 lb), SpO2 100 %, unknown if currently breastfeeding.  Physical Exam:  General: alert, cooperative and no distress Lochia: appropriate Uterine Fundus: firm perineum: healing well DVT Evaluation: No evidence of DVT seen on physical exam. Negative Homan's sign. No cords or calf tenderness.   Recent Labs  06/19/14 0535 06/20/14 0615  HGB 13.8 12.4  HCT 40.4 36.5    Assessment/Plan: Discharge home, Breastfeeding and Circumcision prior to discharge   LOS: 1 day   Satomi Buda STACIA 06/20/2014, 9:43 AM

## 2014-06-20 NOTE — Discharge Summary (Signed)
Obstetric Discharge Summary Reason for Admission: onset of labor Prenatal Procedures: NST and ultrasound Intrapartum Procedures: spontaneous vaginal delivery Postpartum Procedures: none Complications-Operative and Postpartum: 2nd degree perineal laceration HEMOGLOBIN  Date Value Ref Range Status  06/20/2014 12.4 12.0 - 15.0 g/dL Final   HCT  Date Value Ref Range Status  06/20/2014 36.5 36.0 - 46.0 % Final    Physical Exam:  General: alert, cooperative and no distress Lochia: appropriate Uterine Fundus: firm perineum: healing well DVT Evaluation: No evidence of DVT seen on physical exam. Negative Homan's sign. No cords or calf tenderness. Discharge Diagnoses: Term Pregnancy-delivered  Discharge Information: Date: 06/20/2014 Activity: pelvic rest Diet: routine Medications: PNV, Ibuprofen and Percocet Condition: stable Instructions: refer to practice specific booklet Discharge to: home   Newborn Data: Live born female  Birth Weight: 8 lb 1.3 oz (3665 g) APGAR: 9, 9  Home with mother.  Essie HartINN, Quantavious Eggert STACIA 06/20/2014, 9:46 AM

## 2015-11-07 ENCOUNTER — Other Ambulatory Visit: Payer: Self-pay | Admitting: Obstetrics and Gynecology

## 2015-12-05 LAB — OB RESULTS CONSOLE HIV ANTIBODY (ROUTINE TESTING): HIV: NONREACTIVE

## 2015-12-05 LAB — OB RESULTS CONSOLE GC/CHLAMYDIA
CHLAMYDIA, DNA PROBE: NEGATIVE
GC PROBE AMP, GENITAL: NEGATIVE

## 2015-12-05 LAB — OB RESULTS CONSOLE ABO/RH: RH Type: POSITIVE

## 2015-12-05 LAB — OB RESULTS CONSOLE HEPATITIS B SURFACE ANTIGEN: Hepatitis B Surface Ag: NEGATIVE

## 2015-12-05 LAB — OB RESULTS CONSOLE RPR: RPR: NONREACTIVE

## 2015-12-05 LAB — OB RESULTS CONSOLE RUBELLA ANTIBODY, IGM: RUBELLA: IMMUNE

## 2016-03-17 NOTE — L&D Delivery Note (Signed)
Delivery Note At 11:57 PM a viable and healthy female was delivered via Vaginal, Spontaneous Delivery (Presentation: Left Occiput; Anterior ).  APGAR: 9, 9; weight pending .   Placenta status: spontaneous, intact.  Cord: 3V  Anesthesia:  Epidural Episiotomy: None Lacerations: 2nd degree Suture Repair: 3.0 vicryl Est. Blood Loss (mL):  200 cc  Mom to postpartum.  Baby to Couplet care / Skin to Skin.  Druscilla Petsch H. 06/12/2016, 12:28 AM

## 2016-04-04 ENCOUNTER — Inpatient Hospital Stay (HOSPITAL_COMMUNITY)
Admission: AD | Admit: 2016-04-04 | Discharge: 2016-04-04 | Disposition: A | Discharge status: Home / Self Care | Payer: BC Managed Care – PPO | Source: Ambulatory Visit | Attending: Obstetrics | Admitting: Obstetrics

## 2016-04-04 ENCOUNTER — Inpatient Hospital Stay (HOSPITAL_COMMUNITY): Payer: BC Managed Care – PPO

## 2016-04-04 ENCOUNTER — Encounter (HOSPITAL_COMMUNITY): Payer: Self-pay

## 2016-04-04 DIAGNOSIS — O26893 Other specified pregnancy related conditions, third trimester: Secondary | ICD-10-CM | POA: Diagnosis present

## 2016-04-04 DIAGNOSIS — O99613 Diseases of the digestive system complicating pregnancy, third trimester: Secondary | ICD-10-CM | POA: Insufficient documentation

## 2016-04-04 DIAGNOSIS — Z3A29 29 weeks gestation of pregnancy: Secondary | ICD-10-CM | POA: Insufficient documentation

## 2016-04-04 DIAGNOSIS — R1011 Right upper quadrant pain: Secondary | ICD-10-CM | POA: Diagnosis not present

## 2016-04-04 DIAGNOSIS — R110 Nausea: Secondary | ICD-10-CM | POA: Insufficient documentation

## 2016-04-04 DIAGNOSIS — R1010 Upper abdominal pain, unspecified: Secondary | ICD-10-CM | POA: Diagnosis not present

## 2016-04-04 DIAGNOSIS — K219 Gastro-esophageal reflux disease without esophagitis: Secondary | ICD-10-CM | POA: Diagnosis not present

## 2016-04-04 DIAGNOSIS — K824 Cholesterolosis of gallbladder: Secondary | ICD-10-CM | POA: Diagnosis not present

## 2016-04-04 DIAGNOSIS — Z8249 Family history of ischemic heart disease and other diseases of the circulatory system: Secondary | ICD-10-CM | POA: Diagnosis not present

## 2016-04-04 DIAGNOSIS — Z3689 Encounter for other specified antenatal screening: Secondary | ICD-10-CM

## 2016-04-04 LAB — COMPREHENSIVE METABOLIC PANEL
ALK PHOS: 97 U/L (ref 38–126)
ALT: 15 U/L (ref 14–54)
AST: 15 U/L (ref 15–41)
Albumin: 3.1 g/dL — ABNORMAL LOW (ref 3.5–5.0)
Anion gap: 7 (ref 5–15)
BILIRUBIN TOTAL: 0.4 mg/dL (ref 0.3–1.2)
BUN: 6 mg/dL (ref 6–20)
CO2: 21 mmol/L — AB (ref 22–32)
CREATININE: 0.36 mg/dL — AB (ref 0.44–1.00)
Calcium: 8.5 mg/dL — ABNORMAL LOW (ref 8.9–10.3)
Chloride: 110 mmol/L (ref 101–111)
GFR calc Af Amer: >60 mL/min (ref >60)
GFR calc non Af Amer: >60 mL/min (ref >60)
Glucose, Bld: 88 mg/dL (ref 65–99)
Potassium: 4 mmol/L (ref 3.5–5.1)
SODIUM: 138 mmol/L (ref 135–145)
Total Protein: 6.3 g/dL — ABNORMAL LOW (ref 6.5–8.1)

## 2016-04-04 LAB — LIPASE, BLOOD: Lipase: 17 U/L (ref 11–51)

## 2016-04-04 LAB — CBC
HEMATOCRIT: 34.3 % — AB (ref 36.0–46.0)
HEMOGLOBIN: 11.9 g/dL — AB (ref 12.0–15.0)
MCH: 33.3 pg (ref 26.0–34.0)
MCHC: 34.7 g/dL (ref 30.0–36.0)
MCV: 96.1 fL (ref 78.0–100.0)
Platelets: 219 10*3/uL (ref 150–400)
RBC: 3.57 MIL/uL — AB (ref 3.87–5.11)
RDW: 13.7 % (ref 11.5–15.5)
WBC: 15.2 10*3/uL — ABNORMAL HIGH (ref 4.0–10.5)

## 2016-04-04 LAB — AMYLASE: Amylase: 25 U/L — ABNORMAL LOW (ref 28–100)

## 2016-04-04 LAB — URINALYSIS, ROUTINE W REFLEX MICROSCOPIC
Bilirubin Urine: NEGATIVE
Glucose, UA: NEGATIVE mg/dL
Hgb urine dipstick: NEGATIVE
Ketones, ur: NEGATIVE mg/dL
NITRITE: NEGATIVE
PH: 6 (ref 5.0–8.0)
Protein, ur: NEGATIVE mg/dL
SPECIFIC GRAVITY, URINE: 1.021 (ref 1.005–1.030)

## 2016-04-04 MED ORDER — PANTOPRAZOLE SODIUM 20 MG PO TBEC: 20.0000 mg | DELAYED_RELEASE_TABLET | Freq: Every day | ORAL | 0 refills | Status: DC

## 2016-04-04 MED ORDER — GI COCKTAIL ~~LOC~~
30.0000 mL | Freq: Once | ORAL | Status: AC
  Administered 2016-04-04: 30 mL via ORAL
  Filled 2016-04-04: qty 30

## 2016-04-04 NOTE — MAU Provider Note (Signed)
History     CSN: [id number]  Arrival date and time: 04/04/16 0916   None     Chief Complaint  Patient presents with  . Abdominal Pain  . Nausea   G4P2012 @29 .1 weeks here with upper abdominal pain since 5am. She describes as "gas pain", intermittent, and rates pain 8/10. She has had 4 other similar episodes in the last year with the last one being in December 2017. She reports some nausea this am, no vomiting. She last ate around 5:30 a granola bar and OJ and reports worsening nausea and pain since. She reports hx of GERD and uses club soda to ease sx although this did not help this morning. She reports good FM. No VB, LOF, or ctx. Review of records reveals pregnancy has been uncomplicated to date.    OB History    Gravida Para Term Preterm AB Living   4 2 2  0 1 2   SAB TAB Ectopic Multiple Live Births   0 1 0 0 2      Past Medical History:  Diagnosis Date  . Hernia, inguinal, bilateral   . Mental disorder    pt hospitalized @ [facility]; cutter  . No pertinent past medical history     Past Surgical History:  Procedure Laterality Date  . HERNIA REPAIR      Family History  Problem Relation Age of Onset  . Hypertension Father   . Hypertension Paternal Aunt   . Heart disease Paternal Grandfather     Social History  Substance Use Topics  . Smoking status: Never Smoker  . Smokeless tobacco: Never Used  . Alcohol use No    Allergies: No Known Allergies  Prescriptions Prior to Admission  Medication Sig Dispense Refill Last Dose  . acetaminophen (TYLENOL) 500 MG tablet Take 1,000 mg by mouth every 6 (six) hours as needed for mild pain or headache.   04/03/2016 at Unknown time  . Prenatal Vit-Fe Fumarate-FA (PRENATAL MULTIVITAMIN) TABS Take 1 tablet by mouth daily.   04/03/2016 at Unknown time    Review of Systems  Constitutional: Negative for chills and fever.  Gastrointestinal: Positive for abdominal pain (epigastric) and nausea. Negative for constipation, diarrhea and  vomiting.  Genitourinary: Negative for pelvic pain.   Physical Exam   Blood pressure 120/63, pulse 114, temperature 98.4 F (36.9 C), temperature source Oral, resp. rate 20, weight 69.1 kg (152 lb 4 oz), unknown if currently breastfeeding.  Physical Exam  Nursing note and vitals reviewed. Constitutional: She is oriented to person, place, and time. She appears well-developed and well-nourished. No distress.  HENT:  Head: Normocephalic.  Neck: Normal range of motion.  Cardiovascular: Normal rate.   Respiratory: Effort normal.  GI: Soft. She exhibits no distension and no mass. There is tenderness (mild, left mid abdomen). There is no rebound and no guarding.  Musculoskeletal: Normal range of motion.  Neurological: She is alert and oriented to person, place, and time.  Skin: Skin is warm and dry.  Psychiatric: She has a normal mood and affect.   EFM: 130 bpm, mod variability, + accels, no decels Toco: none  Results for orders placed or performed during the hospital encounter of 04/04/16 (from the past 24 hour(s))  Urinalysis, Routine w reflex microscopic     Status: Abnormal   Collection Time: 04/04/16  9:25 AM  Result Value Ref Range   Color, Urine YELLOW YELLOW   APPearance HAZY (A) CLEAR   Specific Gravity, Urine 1.021 1.005 - 1.030  pH 6.0 5.0 - 8.0   Glucose, UA NEGATIVE NEGATIVE mg/dL   Hgb urine dipstick NEGATIVE NEGATIVE   Bilirubin Urine NEGATIVE NEGATIVE   Ketones, ur NEGATIVE NEGATIVE mg/dL   Protein, ur NEGATIVE NEGATIVE mg/dL   Nitrite NEGATIVE NEGATIVE   Leukocytes, UA TRACE (A) NEGATIVE   RBC / HPF 0-5 0 - 5 RBC/hpf   WBC, UA 0-5 0 - 5 WBC/hpf   Bacteria, UA RARE (A) NONE SEEN   Squamous Epithelial / LPF 6-30 (A) NONE SEEN   Mucous PRESENT    Ca Oxalate Crys, UA PRESENT   CBC     Status: Abnormal   Collection Time: 04/04/16 10:35 AM  Result Value Ref Range   WBC 15.2 (H) 4.0 - 10.5 K/uL   RBC 3.57 (L) 3.87 - 5.11 MIL/uL   Hemoglobin 11.9 (L) 12.0 - 15.0  g/dL   HCT 16.1 (L) 09.6 - 04.5 %   MCV 96.1 78.0 - 100.0 fL   MCH 33.3 26.0 - 34.0 pg   MCHC 34.7 30.0 - 36.0 g/dL   RDW 40.9 81.1 - 91.4 %   Platelets 219 150 - 400 K/uL  Comprehensive metabolic panel     Status: Abnormal   Collection Time: 04/04/16 10:35 AM  Result Value Ref Range   Sodium 138 135 - 145 mmol/L   Potassium 4.0 3.5 - 5.1 mmol/L   Chloride 110 101 - 111 mmol/L   CO2 21 (L) 22 - 32 mmol/L   Glucose, Bld 88 65 - 99 mg/dL   BUN 6 6 - 20 mg/dL   Creatinine, Ser 7.82 (L) 0.44 - 1.00 mg/dL   Calcium 8.5 (L) 8.9 - 10.3 mg/dL   Total Protein 6.3 (L) 6.5 - 8.1 g/dL   Albumin 3.1 (L) 3.5 - 5.0 g/dL   AST 15 15 - 41 U/L   ALT 15 14 - 54 U/L   Alkaline Phosphatase 97 38 - 126 U/L   Total Bilirubin 0.4 0.3 - 1.2 mg/dL   GFR calc non Af Amer >60 >60 mL/min   GFR calc Af Amer >60 >60 mL/min   Anion gap 7 5 - 15  Amylase     Status: Abnormal   Collection Time: 04/04/16 10:35 AM  Result Value Ref Range   Amylase 25 (L) 28 - 100 U/L  Lipase, blood     Status: None   Collection Time: 04/04/16 10:35 AM  Result Value Ref Range   Lipase 17 11 - 51 U/L   US Abdomen Limited Ruq  Result Date: 04/04/2016 CLINICAL DATA:  Right upper quadrant pain. EXAM: US ABDOMEN LIMITED - RIGHT UPPER QUADRANT COMPARISON:  No prior. FINDINGS: Gallbladder: 4.8 mm polypoid densities noted with no shadowing. Density is non mobile. Is most likely tiny polyp. Common bile duct: Diameter: 2.7 mm Liver: No focal lesion identified. Within normal limits in parenchymal echogenicity. IMPRESSION: Tiny gallbladder polyp.  Exam otherwise negative. Electronically Signed   By: Maisie Fus  Register   On: 04/04/2016 12:01   MAU Course  Procedures GI cocktail  MDM Labs and [place] ordered and reviewed. Pt reports improvement of pain after GI cocktail. No evidence of acute abdominal/GI process. Presentation, clinical findings, labs and imaging discussed with Dr. Chestine Spore, recommends EKG, if nml ok for discharge home.  EKG-nml  interpretation per Dr. Allyson Sabal in Cardiology.  Stable for discharge home.    Assessment and Plan   1. [redacted] weeks gestation of pregnancy   2. Upper abdominal pain   3. NST (non-stress  test) reactive   4. Gastroesophageal reflux disease without esophagitis    Discharge home [facility] at [facility] as scheduled next week GERD diet Rx Protonix  Allergies as of 04/04/2016   No Known Allergies     Medication List    TAKE these medications   acetaminophen 500 MG tablet Commonly known as:  TYLENOL Take 1,000 mg by mouth every 6 (six) hours as needed for mild pain or headache.   pantoprazole 20 MG tablet Commonly known as:  PROTONIX Take 1 tablet (20 mg total) by mouth daily.   prenatal multivitamin Tabs tablet Take 1 tablet by mouth daily.      Donette Larry, CNM 04/04/2016, 9:53 AM

## 2016-04-04 NOTE — Discharge Instructions (Signed)
Heartburn During Pregnancy Heartburn is a type of pain or discomfort that can happen in the throat or chest. It is often described as a burning sensation. Heartburn is common during pregnancy because:  A hormone (progesterone) that is released during pregnancy may relax the valve (lower esophageal sphincter, or LES) that separates the esophagus from the stomach. This allows stomach acid to move up into the esophagus, causing heartburn.  The uterus gets larger and pushes up on the stomach, which pushes more acid into the esophagus. This is especially true in the later stages of pregnancy. Heartburn usually goes away or gets better after giving birth. What are the causes? Heartburn is caused by stomach acid backing up into the esophagus (reflux). Reflux can be triggered by:  Changing hormone levels.  Large meals.  Certain foods and beverages, such as coffee, chocolate, onions, and peppermint.  Exercise.  Increased stomach acid production. What increases the risk? You are more likely to experience heartburn during pregnancy if you:  Had heartburn prior to becoming pregnant.  Have been pregnant more than once before.  Are overweight or obese. The likelihood that you will get heartburn also increases as you get farther along in your pregnancy, especially during the last trimester. What are the signs or symptoms? Symptoms of this condition include:  Burning pain in the chest or lower throat.  Bitter taste in the mouth.  Coughing.  Problems swallowing.  Vomiting.  Hoarse voice.  Asthma. Symptoms may get worse when you lie down or bend over. Symptoms are often worse at night. How is this diagnosed? This condition is diagnosed based on:  Your medical history.  Your symptoms.  Blood tests to check for a certain type of bacteria associated with heartburn.  Whether taking heartburn medicine relieves your symptoms.  Examination of the stomach and esophagus using a tube with  a light and camera on the end (endoscopy). How is this treated? Treatment varies depending on how severe your symptoms are. Your health care provider may recommend:  Over-the-counter medicines (antacids or acid reducers) for mild heartburn.  Prescription medicines to decrease stomach acid or to protect your stomach lining.  Certain changes in your diet.  Raising the head of your bed so it is higher than the foot of the bed. This helps prevent stomach acid from backing up into the esophagus when you are lying down. Follow these instructions at home: Eating and drinking  Do not drink alcohol during your pregnancy.  Identify foods and beverages that make your symptoms worse, and avoid them.  Beverages that you may want to avoid include:  Coffee and tea (with or without caffeine).  Energy drinks and sports drinks.  Carbonated drinks or sodas.  Citrus fruit juices.  Foods that you may want to avoid include:  Chocolate and cocoa.  Peppermint and mint flavorings.  Garlic, onions, and horseradish.  Spicy and acidic foods, including peppers, chili powder, curry powder, vinegar, hot sauces, and barbecue sauce.  Citrus fruits, such as oranges, lemons, and limes.  Tomato-based foods, such as red sauce, chili, and salsa.  Fried and fatty foods, such as donuts, french fries, potato chips, and high-fat dressings.  High-fat meats, such as hot dogs, cold cuts, sausage, ham, and bacon.  High-fat dairy items, such as whole milk, butter, and cheese.  Eat small, frequent meals instead of large meals.  Avoid drinking large amounts of liquid with your meals.  Avoid eating meals during the 2-3 hours before bedtime.  Avoid lying down right  after you eat.  Do not exercise right after you eat. Medicines  Take over-the-counter and prescription medicines only as told by your health care provider.  Do not take aspirin, ibuprofen, or other NSAIDs unless your health care provider tells  you to do that.  You may be instructed to avoid medicines that contain sodium bicarbonate. General instructions  If directed, raise the head of your bed about 6 inches (15 cm) by putting blocks under the legs. Sleeping with more pillows does not effectively relieve heartburn because it only changes the position of your head.  Do not use any products that contain nicotine or tobacco, such as cigarettes and e-cigarettes. If you need help quitting, ask your health care provider.  Wear loose-fitting clothing.  Try to reduce your stress, such as with yoga or meditation. If you need help managing stress, ask your health care provider.  Maintain a healthy weight. If you are overweight, work with your health care provider to safely lose weight.  Keep all follow-up visits as told by your health care provider. This is important. Contact a health care provider if:  You develop new symptoms.  Your symptoms do not improve with treatment.  You have unexplained weight loss.  You have difficulty swallowing.  You make loud sounds when you breathe (wheeze).  You have a cough that does not go away.  You have frequent heartburn for more than 2 weeks.  You have nausea or vomiting that does not get better with treatment.  You have pain in your abdomen. Get help right away if:  You have severe chest pain that spreads to your arm, neck, or jaw.  You feel sweaty, dizzy, or light-headed.  You have shortness of breath.  You have pain when swallowing.  You vomit, and your vomit looks like blood or coffee grounds.  Your stool is bloody or black. This information is not intended to replace advice given to you by your health care provider. Make sure you discuss any questions you have with your health care provider. Document Released: 02/29/2000 Document Revised: 11/19/2015 Document Reviewed: 11/19/2015 Elsevier Interactive Patient Education  2017 [employer] Food Choices for Gastroesophageal  Reflux Disease, Adult When you have gastroesophageal reflux disease (GERD), the foods you eat and your eating habits are very important. Choosing the right foods can help ease the discomfort of GERD. What general guidelines do I need to follow?  Choose fruits, vegetables, whole grains, low-fat dairy products, and low-fat meat, fish, and poultry.  Limit fats such as oils, salad dressings, butter, nuts, and avocado.  Keep a food diary to identify foods that cause symptoms.  Avoid foods that cause reflux. These may be different for different people.  Eat frequent small meals instead of three large meals each day.  Eat your meals slowly, in a relaxed setting.  Limit fried foods.  Cook foods using methods other than frying.  Avoid drinking alcohol.  Avoid drinking large amounts of liquids with your meals.  Avoid bending over or lying down until 2-3 hours after eating. What foods are not recommended? The following are some foods and drinks that may worsen your symptoms: Vegetables  Tomatoes. Tomato juice. Tomato and spaghetti sauce. Chili peppers. Onion and garlic. Horseradish. Fruits  Oranges, grapefruit, and lemon (fruit and juice). Meats  High-fat meats, fish, and poultry. This includes hot dogs, ribs, ham, sausage, salami, and bacon. Dairy  Whole milk and chocolate milk. Sour cream. Cream. Butter. Ice cream. Cream cheese. Beverages  Coffee and tea,  Coffee and tea, with or without caffeine. Carbonated beverages or energy drinks. °Condiments  °Hot sauce. Barbecue sauce. °Sweets/Desserts  °Chocolate and cocoa. Donuts. Peppermint and spearmint. °Fats and Oils  °High-fat foods, including French fries and potato chips. °Other  °Vinegar. Strong spices, such as black pepper, white pepper, red pepper, cayenne, curry powder, cloves, ginger, and chili powder. °The items listed above may not be a complete list of foods and beverages to avoid. Contact your dietitian for more information.  °This  information is not intended to replace advice given to you by your health care provider. Make sure you discuss any questions you have with your health care provider. °Document Released: 03/03/2005 Document Revised: 08/09/2015 Document Reviewed: 01/05/2013 °Elsevier Interactive Patient Education © 2017 Elsevier Inc. ° °

## 2016-04-04 NOTE — MAU Note (Signed)
5th time this has happened in over a yr.  Thought it was gas pain at first.  Pain in upper abd. Pain in back woke her.  Feels nauseated.  After last episode, had blood work- was negative for [employer].

## 2016-05-19 ENCOUNTER — Other Ambulatory Visit: Payer: Self-pay | Admitting: Obstetrics & Gynecology

## 2016-06-09 ENCOUNTER — Other Ambulatory Visit: Payer: Self-pay | Admitting: Obstetrics & Gynecology

## 2016-06-09 ENCOUNTER — Telehealth (HOSPITAL_COMMUNITY): Payer: Self-pay | Admitting: *Deleted

## 2016-06-09 NOTE — Telephone Encounter (Signed)
Preadmission screen  

## 2016-06-11 ENCOUNTER — Inpatient Hospital Stay (HOSPITAL_COMMUNITY): Payer: BC Managed Care – PPO | Admitting: Anesthesiology

## 2016-06-11 ENCOUNTER — Encounter (HOSPITAL_COMMUNITY): Payer: Self-pay | Admitting: *Deleted

## 2016-06-11 ENCOUNTER — Inpatient Hospital Stay (HOSPITAL_COMMUNITY)
Admission: AD | Admit: 2016-06-11 | Discharge: 2016-06-13 | DRG: 775 | Disposition: A | Discharge status: Home / Self Care | Payer: BC Managed Care – PPO | Source: Ambulatory Visit | Attending: Obstetrics and Gynecology | Admitting: Obstetrics and Gynecology

## 2016-06-11 DIAGNOSIS — Z3A38 38 weeks gestation of pregnancy: Secondary | ICD-10-CM

## 2016-06-11 DIAGNOSIS — Z3A39 39 weeks gestation of pregnancy: Secondary | ICD-10-CM

## 2016-06-11 DIAGNOSIS — O99824 Streptococcus B carrier state complicating childbirth: Secondary | ICD-10-CM | POA: Diagnosis present

## 2016-06-11 DIAGNOSIS — Z3493 Encounter for supervision of normal pregnancy, unspecified, third trimester: Secondary | ICD-10-CM | POA: Diagnosis present

## 2016-06-11 DIAGNOSIS — Z8249 Family history of ischemic heart disease and other diseases of the circulatory system: Secondary | ICD-10-CM | POA: Diagnosis not present

## 2016-06-11 DIAGNOSIS — Z3490 Encounter for supervision of normal pregnancy, unspecified, unspecified trimester: Secondary | ICD-10-CM

## 2016-06-11 HISTORY — DX: Other specified health status: Z78.9

## 2016-06-11 LAB — CBC
HCT: 40.1 % (ref 36.0–46.0)
HEMOGLOBIN: 14 g/dL (ref 12.0–15.0)
MCH: 33.7 pg (ref 26.0–34.0)
MCHC: 34.9 g/dL (ref 30.0–36.0)
MCV: 96.6 fL (ref 78.0–100.0)
PLATELETS: 249 10*3/uL (ref 150–400)
RBC: 4.15 MIL/uL (ref 3.87–5.11)
RDW: 14.3 % (ref 11.5–15.5)
WBC: 12.5 10*3/uL — ABNORMAL HIGH (ref 4.0–10.5)

## 2016-06-11 LAB — TYPE AND SCREEN
ABO/RH(D): O POS
ANTIBODY SCREEN: NEGATIVE

## 2016-06-11 LAB — OB RESULTS CONSOLE GBS: GBS: POSITIVE

## 2016-06-11 MED ORDER — OXYTOCIN 40 UNITS IN LACTATED RINGERS INFUSION - SIMPLE MED: 1.0000 m[IU]/min | INTRAVENOUS | Status: DC

## 2016-06-11 MED ORDER — LIDOCAINE HCL (PF) 1 % IJ SOLN: 30.0000 mL | INTRAMUSCULAR | Status: DC | PRN

## 2016-06-11 MED ORDER — OXYCODONE-ACETAMINOPHEN 5-325 MG PO TABS: 1.0000 | ORAL_TABLET | ORAL | Status: DC | PRN

## 2016-06-11 MED ORDER — OXYCODONE-ACETAMINOPHEN 5-325 MG PO TABS: 2.0000 | ORAL_TABLET | ORAL | Status: DC | PRN

## 2016-06-11 MED ORDER — LACTATED RINGERS IV SOLN: 500.0000 mL | INTRAVENOUS | Status: DC | PRN

## 2016-06-11 MED ORDER — PHENYLEPHRINE 40 MCG/ML (10ML) SYRINGE FOR IV PUSH (FOR BLOOD PRESSURE SUPPORT)
80.0000 ug | PREFILLED_SYRINGE | INTRAVENOUS | Status: DC | PRN
  Administered 2016-06-11 (×2): 80 ug via INTRAVENOUS
  Filled 2016-06-11 (×2): qty 10
  Filled 2016-06-11: qty 5

## 2016-06-11 MED ORDER — PHENYLEPHRINE 40 MCG/ML (10ML) SYRINGE FOR IV PUSH (FOR BLOOD PRESSURE SUPPORT)
80.0000 ug | PREFILLED_SYRINGE | INTRAVENOUS | Status: DC | PRN
  Administered 2016-06-11 (×2): 80 ug via INTRAVENOUS
  Filled 2016-06-11: qty 5

## 2016-06-11 MED ORDER — TERBUTALINE SULFATE 1 MG/ML IJ SOLN: 0.2500 mg | Freq: Once | INTRAMUSCULAR | Status: DC | PRN

## 2016-06-11 MED ORDER — FENTANYL 2.5 MCG/ML BUPIVACAINE 1/10 % EPIDURAL INFUSION (WH - ANES)
14.0000 mL/h | INTRAMUSCULAR | Status: DC | PRN
  Administered 2016-06-11: 14 mL/h via EPIDURAL
  Filled 2016-06-11: qty 100

## 2016-06-11 MED ORDER — OXYTOCIN 40 UNITS IN LACTATED RINGERS INFUSION - SIMPLE MED
1.0000 m[IU]/min | INTRAVENOUS | Status: DC
  Administered 2016-06-11: 2 m[IU]/min via INTRAVENOUS
  Filled 2016-06-11: qty 1000

## 2016-06-11 MED ORDER — ONDANSETRON HCL 4 MG/2ML IJ SOLN
4.0000 mg | Freq: Four times a day (QID) | INTRAMUSCULAR | Status: DC | PRN
  Administered 2016-06-11: 4 mg via INTRAVENOUS
  Filled 2016-06-11: qty 2

## 2016-06-11 MED ORDER — ACETAMINOPHEN 325 MG PO TABS: 650.0000 mg | ORAL_TABLET | ORAL | Status: DC | PRN

## 2016-06-11 MED ORDER — BUTORPHANOL TARTRATE 1 MG/ML IJ SOLN: 1.0000 mg | INTRAMUSCULAR | Status: DC | PRN

## 2016-06-11 MED ORDER — BUTORPHANOL TARTRATE 1 MG/ML IJ SOLN: 1.0000 mg | Freq: Once | INTRAMUSCULAR | Status: DC

## 2016-06-11 MED ORDER — LIDOCAINE HCL (PF) 1 % IJ SOLN
INTRAMUSCULAR | Status: DC | PRN
  Administered 2016-06-11 (×2): 5 mL via EPIDURAL

## 2016-06-11 MED ORDER — SOD CITRATE-CITRIC ACID 500-334 MG/5ML PO SOLN: 30.0000 mL | ORAL | Status: DC | PRN

## 2016-06-11 MED ORDER — LACTATED RINGERS IV SOLN: INTRAVENOUS | Status: DC

## 2016-06-11 MED ORDER — OXYTOCIN 40 UNITS IN LACTATED RINGERS INFUSION - SIMPLE MED: 2.5000 [IU]/h | INTRAVENOUS | Status: DC

## 2016-06-11 MED ORDER — DEXTROSE 5 % IV SOLN
5.0000 10*6.[IU] | Freq: Once | INTRAVENOUS | Status: AC
  Administered 2016-06-11: 5 10*6.[IU] via INTRAVENOUS
  Filled 2016-06-11: qty 5

## 2016-06-11 MED ORDER — LACTATED RINGERS IV SOLN: 500.0000 mL | Freq: Once | INTRAVENOUS | Status: DC

## 2016-06-11 MED ORDER — OXYTOCIN BOLUS FROM INFUSION
500.0000 mL | Freq: Once | INTRAVENOUS | Status: AC
  Administered 2016-06-12: 500 mL via INTRAVENOUS

## 2016-06-11 MED ORDER — OXYTOCIN BOLUS FROM INFUSION: 500.0000 mL | Freq: Once | INTRAVENOUS | Status: DC

## 2016-06-11 MED ORDER — LIDOCAINE HCL (PF) 1 % IJ SOLN
30.0000 mL | INTRAMUSCULAR | Status: DC | PRN
  Administered 2016-06-12: 30 mL via SUBCUTANEOUS
  Filled 2016-06-11: qty 30

## 2016-06-11 MED ORDER — OXYTOCIN 40 UNITS IN LACTATED RINGERS INFUSION - SIMPLE MED
2.5000 [IU]/h | INTRAVENOUS | Status: DC
  Administered 2016-06-12: 2.5 [IU]/h via INTRAVENOUS

## 2016-06-11 MED ORDER — ONDANSETRON HCL 4 MG/2ML IJ SOLN: 4.0000 mg | Freq: Four times a day (QID) | INTRAMUSCULAR | Status: DC | PRN

## 2016-06-11 MED ORDER — EPHEDRINE 5 MG/ML INJ
10.0000 mg | INTRAVENOUS | Status: DC | PRN
  Filled 2016-06-11: qty 2

## 2016-06-11 MED ORDER — PENICILLIN G POT IN DEXTROSE 60000 UNIT/ML IV SOLN
3.0000 10*6.[IU] | INTRAVENOUS | Status: DC
  Administered 2016-06-11 (×2): 3 10*6.[IU] via INTRAVENOUS
  Filled 2016-06-11 (×6): qty 50

## 2016-06-11 MED ORDER — DIPHENHYDRAMINE HCL 50 MG/ML IJ SOLN: 12.5000 mg | INTRAMUSCULAR | Status: DC | PRN

## 2016-06-11 MED ORDER — TERBUTALINE SULFATE 1 MG/ML IJ SOLN
0.2500 mg | Freq: Once | INTRAMUSCULAR | Status: DC | PRN
  Filled 2016-06-11: qty 1

## 2016-06-11 MED ORDER — SODIUM CHLORIDE 0.9 % IV SOLN: 2.0000 g | Freq: Once | INTRAVENOUS | Status: DC

## 2016-06-11 MED ORDER — LACTATED RINGERS IV SOLN
INTRAVENOUS | Status: DC
  Administered 2016-06-11: 14:00:00 via INTRAVENOUS

## 2016-06-11 NOTE — MAU Note (Signed)
Pt reports contractions on and off since about 650am. Denies and vag bleeding or leaking. 2cm on Friday.

## 2016-06-11 NOTE — H&P (Signed)
Vanessa Reed is a 36 y.o. female presenting for painful contractions  36 yo [id number] @ 38+6 presents for painful contractions. When the patient was initially placed on the external fetal monitor there were a few areas in the tracing that looked concerning for variables versus decels. After some time the tracing was felt to be reactive. Given early labor, GBS +, and initially questionable fetal tracing the decision was made to admit the patient for labor and augment if needed OB History    Gravida Para Term Preterm AB Living   4 2 2  0 1 2   SAB TAB Ectopic Multiple Live Births   0 1 0 0 2     Past Medical History:  Diagnosis Date  . Hernia, inguinal, bilateral   . Medical history non-contributory   . Mental disorder    pt hospitalized @ [facility]; [facility]  . No pertinent past medical history    Past Surgical History:  Procedure Laterality Date  . HERNIA REPAIR     Family History: family history includes Heart disease in her paternal grandfather; Hypertension in her father and paternal aunt. Social History:  reports that she has never smoked. She has never used smokeless tobacco. She reports that she does not drink alcohol or use drugs.     Maternal Diabetes: No Genetic Screening: Normal Maternal Ultrasounds/Referrals: Normal Fetal Ultrasounds or other Referrals:  None Maternal Substance Abuse:  No Significant Maternal Medications:  None Significant Maternal Lab Results:  None Other Comments:  None  ROS History Dilation: 2 Effacement (%): 80 Station: -2 Exam by:: Elsie StainKathy Wilson,RN unknown if currently breastfeeding. Exam Physical Exam  Prenatal labs: ABO, Rh: O/Positive/-- (09/20 0000) Antibody:  neg Rubella: Immune (09/20 0000) RPR: Nonreactive (09/20 0000)  HBsAg: Negative (09/20 0000)  HIV: Non-reactive (09/20 0000)  GBS:   Pos  Assessment/Plan: 1) Admit 2) Epidural on request 3) PCN for GBS 4) AROM when able 5) Pitocin augmentation prn   Karlis Cregg  H. 06/11/2016, 12:51 PM

## 2016-06-11 NOTE — Anesthesia Preprocedure Evaluation (Signed)
Anesthesia Evaluation  Patient identified by MRN, date of birth, ID band Patient awake    Reviewed: Allergy & Precautions, H&P , NPO status , Patient's Chart, lab work & pertinent test results  History of Anesthesia Complications Negative for: history of anesthetic complications  Airway Mallampati: II  TM Distance: >3 FB Neck ROM: full    Dental no notable dental hx. (+) Teeth Intact   Pulmonary neg pulmonary ROS,    Pulmonary exam normal breath sounds clear to auscultation       Cardiovascular negative cardio ROS   Rhythm:regular Rate:Normal     Neuro/Psych negative neurological ROS  negative psych ROS   GI/Hepatic negative GI ROS, Neg liver ROS,   Endo/Other  negative endocrine ROS  Renal/GU negative Renal ROS  negative genitourinary   Musculoskeletal   Abdominal Normal abdominal exam  (+)   Peds  Hematology negative hematology ROS (+)   Anesthesia Other Findings   Reproductive/Obstetrics (+) Pregnancy                             Anesthesia Physical  Anesthesia Plan  ASA: II  Anesthesia Plan: Epidural   Post-op Pain Management:    Induction:   Airway Management Planned:   Additional Equipment:   Intra-op Plan:   Post-operative Plan:   Informed Consent: I have reviewed the patients History and Physical, chart, labs and discussed the procedure including the risks, benefits and alternatives for the proposed anesthesia with the patient or authorized representative who has indicated his/her understanding and acceptance.   Dental advisory given  Plan Discussed with: Anesthesiologist  Anesthesia Plan Comments:         Anesthesia Quick Evaluation

## 2016-06-11 NOTE — Anesthesia Procedure Notes (Signed)
Epidural Patient location during procedure: OB Start time: 06/11/2016 6:37 PM End time: 06/11/2016 6:47 PM  Staffing Anesthesiologist: Heather RobertsSINGER, Rashonda Warrior Performed: anesthesiologist   Preanesthetic Checklist Completed: patient identified, site marked, pre-op evaluation, timeout performed, IV checked, risks and benefits discussed and monitors and equipment checked  Epidural Patient position: sitting Prep: DuraPrep Patient monitoring: heart rate, cardiac monitor, continuous pulse ox and blood pressure Approach: midline Location: L2-L3 Injection technique: LOR saline  Needle:  Needle type: Tuohy  Needle gauge: 17 G Needle length: 9 cm Needle insertion depth: 5 cm Catheter size: 20 Guage Catheter at skin depth: 10 cm Test dose: negative and Other  Assessment Events: blood not aspirated, injection not painful, no injection resistance and negative IV test  Additional Notes Informed consent obtained prior to proceeding including risk of failure, 1% risk of PDPH, risk of minor discomfort and bruising.  Discussed rare but serious complications including epidural abscess, permanent nerve injury, epidural hematoma.  Discussed alternatives to epidural analgesia and patient desires to proceed.  Timeout performed pre-procedure verifying patient name, procedure, and platelet count.  Patient tolerated procedure well.

## 2016-06-11 NOTE — Anesthesia Pain Management Evaluation Note (Signed)
  CRNA Pain Management Visit Note  Patient: Vanessa Reed, 36 y.o., female  "Hello I am a [facility] the [facility]. We have an anesthesia team available at all times to provide care throughout the hospital, including epidural management and anesthesia for C-section. I don't know your plan for the delivery whether it a natural birth, water birth, IV sedation, nitrous supplementation, doula or epidural, but we want to meet your pain goals."   1.Was your pain managed to your expectations on prior hospitalizations?   Yes   2.What is your expectation for pain management during this hospitalization?     Epidural  3.How can we help you reach that goal? Epidural when patient reaches pain goal.  Record the patient's initial score and the patient's pain goal.   Pain: 7  Pain Goal: 10 [facility] wants you to be able to say your pain was always managed very well.  Veva Grimley 06/11/2016

## 2016-06-12 ENCOUNTER — Encounter (HOSPITAL_COMMUNITY): Payer: Self-pay

## 2016-06-12 LAB — CBC
HCT: 34.7 % — ABNORMAL LOW (ref 36.0–46.0)
Hemoglobin: 12.2 g/dL (ref 12.0–15.0)
MCH: 34.1 pg — ABNORMAL HIGH (ref 26.0–34.0)
MCHC: 35.2 g/dL (ref 30.0–36.0)
MCV: 96.9 fL (ref 78.0–100.0)
Platelets: 236 10*3/uL (ref 150–400)
RBC: 3.58 MIL/uL — AB (ref 3.87–5.11)
RDW: 14.3 % (ref 11.5–15.5)
WBC: 19.3 10*3/uL — AB (ref 4.0–10.5)

## 2016-06-12 LAB — RPR: RPR: NONREACTIVE

## 2016-06-12 MED ORDER — METHYLERGONOVINE MALEATE 0.2 MG PO TABS: 0.2000 mg | ORAL_TABLET | ORAL | Status: DC | PRN

## 2016-06-12 MED ORDER — SENNOSIDES-DOCUSATE SODIUM 8.6-50 MG PO TABS
2.0000 | ORAL_TABLET | ORAL | Status: DC
  Administered 2016-06-13: 2 via ORAL
  Filled 2016-06-12: qty 2

## 2016-06-12 MED ORDER — WITCH HAZEL-GLYCERIN EX PADS: 1.0000 "application " | MEDICATED_PAD | CUTANEOUS | Status: DC | PRN

## 2016-06-12 MED ORDER — OXYCODONE-ACETAMINOPHEN 5-325 MG PO TABS: 2.0000 | ORAL_TABLET | ORAL | Status: DC | PRN

## 2016-06-12 MED ORDER — BENZOCAINE-MENTHOL 20-0.5 % EX AERO
1.0000 "application " | INHALATION_SPRAY | CUTANEOUS | Status: DC | PRN
  Administered 2016-06-12: 1 via TOPICAL
  Filled 2016-06-12: qty 56

## 2016-06-12 MED ORDER — PRENATAL MULTIVITAMIN CH
1.0000 | ORAL_TABLET | Freq: Every day | ORAL | Status: DC
  Administered 2016-06-12 – 2016-06-13 (×2): 1 via ORAL
  Filled 2016-06-12 (×2): qty 1

## 2016-06-12 MED ORDER — ONDANSETRON HCL 4 MG/2ML IJ SOLN: 4.0000 mg | INTRAMUSCULAR | Status: DC | PRN

## 2016-06-12 MED ORDER — SIMETHICONE 80 MG PO CHEW: 80.0000 mg | CHEWABLE_TABLET | ORAL | Status: DC | PRN

## 2016-06-12 MED ORDER — METHYLERGONOVINE MALEATE 0.2 MG/ML IJ SOLN: 0.2000 mg | INTRAMUSCULAR | Status: DC | PRN

## 2016-06-12 MED ORDER — TETANUS-DIPHTH-ACELL PERTUSSIS 5-2.5-18.5 LF-MCG/0.5 IM SUSP: 0.5000 mL | Freq: Once | INTRAMUSCULAR | Status: DC

## 2016-06-12 MED ORDER — DIPHENHYDRAMINE HCL 25 MG PO CAPS: 25.0000 mg | ORAL_CAPSULE | Freq: Four times a day (QID) | ORAL | Status: DC | PRN

## 2016-06-12 MED ORDER — ONDANSETRON HCL 4 MG PO TABS: 4.0000 mg | ORAL_TABLET | ORAL | Status: DC | PRN

## 2016-06-12 MED ORDER — ZOLPIDEM TARTRATE 5 MG PO TABS: 5.0000 mg | ORAL_TABLET | Freq: Every evening | ORAL | Status: DC | PRN

## 2016-06-12 MED ORDER — IBUPROFEN 600 MG PO TABS
600.0000 mg | ORAL_TABLET | Freq: Four times a day (QID) | ORAL | Status: DC
  Administered 2016-06-12 – 2016-06-13 (×7): 600 mg via ORAL
  Filled 2016-06-12 (×7): qty 1

## 2016-06-12 MED ORDER — COCONUT OIL OIL: 1.0000 "application " | TOPICAL_OIL | Status: DC | PRN

## 2016-06-12 MED ORDER — OXYCODONE-ACETAMINOPHEN 5-325 MG PO TABS: 1.0000 | ORAL_TABLET | ORAL | Status: DC | PRN

## 2016-06-12 MED ORDER — DIBUCAINE 1 % RE OINT: 1.0000 "application " | TOPICAL_OINTMENT | RECTAL | Status: DC | PRN

## 2016-06-12 MED ORDER — ACETAMINOPHEN 325 MG PO TABS: 650.0000 mg | ORAL_TABLET | ORAL | Status: DC | PRN

## 2016-06-12 NOTE — Anesthesia Postprocedure Evaluation (Signed)
Anesthesia Post Note  Patient: Vanessa Reed  Procedure(s) Performed: * No procedures listed *  Patient location during evaluation: Mother Baby Anesthesia Type: Epidural Level of consciousness: awake and alert Pain management: satisfactory to patient Vital Signs Assessment: post-procedure vital signs reviewed and stable Respiratory status: respiratory function stable Cardiovascular status: stable Postop Assessment: no headache, no backache, epidural receding, patient able to bend at knees, no signs of nausea or vomiting and adequate PO intake Anesthetic complications: no        Last Vitals:  Vitals:   06/12/16 0242 06/12/16 0619  BP: (!) 95/51 (!) 97/48  Pulse: 90 86  Resp:  18  Temp:  36.8 C    Last Pain:  Vitals:   06/12/16 0626  TempSrc:   PainSc: 0-No pain   Pain Goal: Patients Stated Pain Goal: 2 (06/11/16 1339)               Karleen DolphinFUSSELL,Yousuf Ager

## 2016-06-12 NOTE — Progress Notes (Signed)
Patient is doing well.  She is ambulating, voiding, tolerating PO.  Pain control is good.  Lochia is appropriate  Vitals:   06/12/16 0135 06/12/16 0238 06/12/16 0242 06/12/16 0619  BP: (!) 109/54 (!) 90/44 (!) 95/51 (!) 97/48  Pulse: 97 92 90 86  Resp: 18 18  18   Temp: 97.8 F (36.6 C) 98.3 F (36.8 C)  98.3 F (36.8 C)  TempSrc: Oral Oral  Oral  SpO2:      Weight:      Height:        NAD Fundus firm Ext: no edema  Lab Results  Component Value Date   WBC 19.3 (H) 06/12/2016   HGB 12.2 06/12/2016   HCT 34.7 (L) 06/12/2016   MCV 96.9 06/12/2016   PLT 236 06/12/2016    --/--/O POS (03/28 1320)/RImmune  A/P 35 y.o. [id number] PPD#1 (delivered at 2357 on 3/28). Routine care.   Expect d/c tomorrow. Desires circ prior to discharge--baby not yet ready    [facility] CenterDYANNA GEFFEL WewokaLARK

## 2016-06-13 ENCOUNTER — Inpatient Hospital Stay (HOSPITAL_COMMUNITY): Admission: RE | Admit: 2016-06-13 | Payer: 59 | Source: Ambulatory Visit

## 2016-06-13 MED ORDER — IBUPROFEN 600 MG PO TABS: 600.0000 mg | ORAL_TABLET | Freq: Four times a day (QID) | ORAL | 3 refills | Status: DC | PRN

## 2016-06-13 NOTE — Progress Notes (Signed)
Post Partum Day 1 Subjective: no complaints, up ad lib, voiding, tolerating PO, + flatus and mother and baby are bonding well  Objective: Blood pressure (!) 97/58, pulse 67, temperature 97.5 F (36.4 C), temperature source Oral, resp. rate 18, height  (1.626 m), weight 72.6 kg (160 lb), SpO2 100 %, unknown if currently breastfeeding.  Physical Exam:  General: alert, cooperative and no distress Lochia: appropriate Uterine Fundus: firm perineum: healing well, no significant drainage, no dehiscence DVT Evaluation: No evidence of DVT seen on physical exam. Negative Homan's sign. No cords or calf tenderness. No significant calf/ankle edema.   Recent Labs  06/11/16 1320 06/12/16 0509  HGB 14.0 12.2  HCT 40.1 34.7*    Assessment/Plan: Discharge home and Circumcision prior to discharge   LOS: 2 days   Vanessa Reed STACIA 06/13/2016, 11:58 AM

## 2016-06-13 NOTE — Discharge Summary (Signed)
Obstetric Discharge Summary Reason for Admission: onset of labor Prenatal Procedures: none Intrapartum Procedures: spontaneous vaginal delivery Postpartum Procedures: none Complications-Operative and Postpartum: 2nd degree perineal laceration Hemoglobin  Date Value Ref Range Status  06/12/2016 12.2 12.0 - 15.0 g/dL Final   HCT  Date Value Ref Range Status  06/12/2016 34.7 (L) 36.0 - 46.0 % Final    Physical Exam:  General: alert, cooperative and no distress Lochia: appropriate Uterine Fundus: firm perineum: healing well, no significant drainage, no dehiscence DVT Evaluation: No evidence of DVT seen on physical exam. Negative Homan's sign. No cords or calf tenderness. No significant calf/ankle edema.  Discharge Diagnoses: Term Pregnancy-delivered  Discharge Information: Date: 06/13/2016 Activity: pelvic rest Diet: routine Medications: PNV and Ibuprofen Condition: stable Instructions: refer to practice specific booklet Discharge to: home   Newborn Data: Live born female  Birth Weight: 8 lb 8 oz (3856 g) APGAR: 9, 9  Home with mother.  Essie Hart STACIA 06/13/2016, 12:00 PM

## 2016-07-21 ENCOUNTER — Other Ambulatory Visit: Payer: Self-pay | Admitting: Obstetrics and Gynecology

## 2016-07-22 LAB — CYTOLOGY - PAP

## 2017-03-17 NOTE — L&D Delivery Note (Signed)
Delivery Note At 1:33 PM a viable and healthy female was delivered via Vaginal, Spontaneous (Presentation: right occiput anterior  ).  APGAR: 9, 9; weight pending .   Placenta status: spontaneous, intact.  Cord: 3V with loose nuchal x 1  Anesthesia:  None, 1% lidocaine for repair Episiotomy: None Lacerations: 2nd degree;Perineal Suture Repair: 3.0 vicryl Est. Blood Loss (mL):  137 cc  Mom to postpartum.  Baby to Couplet care / Skin to Skin.  Vanessa Reed 02/14/2018, 2:00 PM

## 2017-05-28 ENCOUNTER — Other Ambulatory Visit (HOSPITAL_COMMUNITY): Payer: Self-pay | Admitting: Gastroenterology

## 2017-05-28 DIAGNOSIS — R1013 Epigastric pain: Secondary | ICD-10-CM

## 2017-06-04 ENCOUNTER — Encounter (HOSPITAL_COMMUNITY): Payer: Self-pay | Admitting: Radiology

## 2017-06-04 ENCOUNTER — Encounter (HOSPITAL_COMMUNITY)
Admission: RE | Admit: 2017-06-04 | Discharge: 2017-06-04 | Disposition: A | Discharge status: Home / Self Care | Payer: BC Managed Care – PPO | Source: Ambulatory Visit | Attending: Gastroenterology | Admitting: Gastroenterology

## 2017-06-04 DIAGNOSIS — R1013 Epigastric pain: Secondary | ICD-10-CM | POA: Insufficient documentation

## 2017-06-04 MED ORDER — TECHNETIUM TC 99M MEBROFENIN IV KIT
5.3000 | PACK | Freq: Once | INTRAVENOUS | Status: AC | PRN
  Administered 2017-06-04: 5.3 via INTRAVENOUS

## 2017-07-22 LAB — OB RESULTS CONSOLE GC/CHLAMYDIA
CHLAMYDIA, DNA PROBE: NEGATIVE
GC PROBE AMP, GENITAL: NEGATIVE

## 2017-07-22 LAB — OB RESULTS CONSOLE RPR: RPR: NONREACTIVE

## 2017-07-22 LAB — OB RESULTS CONSOLE ABO/RH: RH TYPE: POSITIVE

## 2017-07-22 LAB — OB RESULTS CONSOLE RUBELLA ANTIBODY, IGM: RUBELLA: IMMUNE

## 2017-07-22 LAB — OB RESULTS CONSOLE HEPATITIS B SURFACE ANTIGEN: Hepatitis B Surface Ag: NEGATIVE

## 2017-07-22 LAB — OB RESULTS CONSOLE ANTIBODY SCREEN: Antibody Screen: NEGATIVE

## 2017-07-22 LAB — OB RESULTS CONSOLE HIV ANTIBODY (ROUTINE TESTING): HIV: NONREACTIVE

## 2018-01-28 LAB — OB RESULTS CONSOLE GBS: GBS: NEGATIVE

## 2018-02-09 ENCOUNTER — Telehealth (HOSPITAL_COMMUNITY): Payer: Self-pay | Admitting: *Deleted

## 2018-02-09 ENCOUNTER — Other Ambulatory Visit: Payer: Self-pay | Admitting: Obstetrics and Gynecology

## 2018-02-09 ENCOUNTER — Encounter (HOSPITAL_COMMUNITY): Payer: Self-pay | Admitting: *Deleted

## 2018-02-09 NOTE — Telephone Encounter (Signed)
Preadmission screen  

## 2018-02-10 ENCOUNTER — Encounter (HOSPITAL_COMMUNITY): Payer: Self-pay | Admitting: *Deleted

## 2018-02-10 ENCOUNTER — Telehealth (HOSPITAL_COMMUNITY): Payer: Self-pay | Admitting: *Deleted

## 2018-02-10 NOTE — Telephone Encounter (Signed)
Preadmission screen  

## 2018-02-14 ENCOUNTER — Encounter (HOSPITAL_COMMUNITY): Payer: Self-pay | Admitting: *Deleted

## 2018-02-14 ENCOUNTER — Inpatient Hospital Stay (HOSPITAL_COMMUNITY)
Admission: AD | Admit: 2018-02-14 | Discharge: 2018-02-15 | DRG: 807 | Disposition: A | Discharge status: Home / Self Care | Payer: BC Managed Care – PPO | Attending: Obstetrics and Gynecology | Admitting: Obstetrics and Gynecology

## 2018-02-14 ENCOUNTER — Other Ambulatory Visit: Payer: Self-pay

## 2018-02-14 DIAGNOSIS — Z3A38 38 weeks gestation of pregnancy: Secondary | ICD-10-CM | POA: Diagnosis not present

## 2018-02-14 DIAGNOSIS — Z3483 Encounter for supervision of other normal pregnancy, third trimester: Secondary | ICD-10-CM | POA: Diagnosis present

## 2018-02-14 LAB — CBC
HCT: 41.9 % (ref 36.0–46.0)
Hemoglobin: 14.2 g/dL (ref 12.0–15.0)
MCH: 33.3 pg (ref 26.0–34.0)
MCHC: 33.9 g/dL (ref 30.0–36.0)
MCV: 98.1 fL (ref 80.0–100.0)
Platelets: 187 10*3/uL (ref 150–400)
RBC: 4.27 MIL/uL (ref 3.87–5.11)
RDW: 13.9 % (ref 11.5–15.5)
WBC: 14.7 10*3/uL — AB (ref 4.0–10.5)
nRBC: 0 % (ref 0.0–0.2)

## 2018-02-14 LAB — TYPE AND SCREEN
ABO/RH(D): O POS
Antibody Screen: NEGATIVE

## 2018-02-14 LAB — POCT FERN TEST: POCT Fern Test: NEGATIVE

## 2018-02-14 MED ORDER — FLEET ENEMA 7-19 GM/118ML RE ENEM: 1.0000 | ENEMA | RECTAL | Status: DC | PRN

## 2018-02-14 MED ORDER — ONDANSETRON HCL 4 MG PO TABS: 4.0000 mg | ORAL_TABLET | ORAL | Status: DC | PRN

## 2018-02-14 MED ORDER — ONDANSETRON HCL 4 MG/2ML IJ SOLN: 4.0000 mg | INTRAMUSCULAR | Status: DC | PRN

## 2018-02-14 MED ORDER — IBUPROFEN 600 MG PO TABS
600.0000 mg | ORAL_TABLET | Freq: Four times a day (QID) | ORAL | Status: DC
  Administered 2018-02-14 – 2018-02-15 (×4): 600 mg via ORAL
  Filled 2018-02-14 (×4): qty 1

## 2018-02-14 MED ORDER — ACETAMINOPHEN 325 MG PO TABS: 650.0000 mg | ORAL_TABLET | ORAL | Status: DC | PRN

## 2018-02-14 MED ORDER — LIDOCAINE HCL (PF) 1 % IJ SOLN
30.0000 mL | INTRAMUSCULAR | Status: DC | PRN
  Administered 2018-02-14: 30 mL via SUBCUTANEOUS
  Filled 2018-02-14: qty 30

## 2018-02-14 MED ORDER — OXYCODONE-ACETAMINOPHEN 5-325 MG PO TABS: 1.0000 | ORAL_TABLET | ORAL | Status: DC | PRN

## 2018-02-14 MED ORDER — OXYCODONE-ACETAMINOPHEN 5-325 MG PO TABS: 2.0000 | ORAL_TABLET | ORAL | Status: DC | PRN

## 2018-02-14 MED ORDER — BENZOCAINE-MENTHOL 20-0.5 % EX AERO
1.0000 "application " | INHALATION_SPRAY | CUTANEOUS | Status: DC | PRN
  Administered 2018-02-14: 1 via TOPICAL
  Filled 2018-02-14: qty 56

## 2018-02-14 MED ORDER — DIBUCAINE 1 % RE OINT: 1.0000 "application " | TOPICAL_OINTMENT | RECTAL | Status: DC | PRN

## 2018-02-14 MED ORDER — LACTATED RINGERS IV SOLN
INTRAVENOUS | Status: DC
  Administered 2018-02-14: 13:00:00 via INTRAVENOUS

## 2018-02-14 MED ORDER — OXYTOCIN 40 UNITS IN LACTATED RINGERS INFUSION - SIMPLE MED
2.5000 [IU]/h | INTRAVENOUS | Status: DC
  Filled 2018-02-14: qty 1000

## 2018-02-14 MED ORDER — METHYLERGONOVINE MALEATE 0.2 MG/ML IJ SOLN: 0.2000 mg | INTRAMUSCULAR | Status: DC | PRN

## 2018-02-14 MED ORDER — SENNOSIDES-DOCUSATE SODIUM 8.6-50 MG PO TABS
2.0000 | ORAL_TABLET | ORAL | Status: DC
  Administered 2018-02-15: 2 via ORAL
  Filled 2018-02-14: qty 2

## 2018-02-14 MED ORDER — SIMETHICONE 80 MG PO CHEW: 80.0000 mg | CHEWABLE_TABLET | ORAL | Status: DC | PRN

## 2018-02-14 MED ORDER — METHYLERGONOVINE MALEATE 0.2 MG PO TABS: 0.2000 mg | ORAL_TABLET | ORAL | Status: DC | PRN

## 2018-02-14 MED ORDER — SOD CITRATE-CITRIC ACID 500-334 MG/5ML PO SOLN: 30.0000 mL | ORAL | Status: DC | PRN

## 2018-02-14 MED ORDER — TETANUS-DIPHTH-ACELL PERTUSSIS 5-2.5-18.5 LF-MCG/0.5 IM SUSP: 0.5000 mL | Freq: Once | INTRAMUSCULAR | Status: DC

## 2018-02-14 MED ORDER — WITCH HAZEL-GLYCERIN EX PADS: 1.0000 "application " | MEDICATED_PAD | CUTANEOUS | Status: DC | PRN

## 2018-02-14 MED ORDER — PRENATAL MULTIVITAMIN CH
1.0000 | ORAL_TABLET | Freq: Every day | ORAL | Status: DC
  Administered 2018-02-15: 1 via ORAL
  Filled 2018-02-14: qty 1

## 2018-02-14 MED ORDER — COCONUT OIL OIL: 1.0000 "application " | TOPICAL_OIL | Status: DC | PRN

## 2018-02-14 MED ORDER — ONDANSETRON HCL 4 MG/2ML IJ SOLN: 4.0000 mg | Freq: Four times a day (QID) | INTRAMUSCULAR | Status: DC | PRN

## 2018-02-14 MED ORDER — LACTATED RINGERS IV SOLN: 500.0000 mL | INTRAVENOUS | Status: DC | PRN

## 2018-02-14 MED ORDER — BUTORPHANOL TARTRATE 1 MG/ML IJ SOLN: 1.0000 mg | INTRAMUSCULAR | Status: DC | PRN

## 2018-02-14 MED ORDER — DIPHENHYDRAMINE HCL 25 MG PO CAPS: 25.0000 mg | ORAL_CAPSULE | Freq: Four times a day (QID) | ORAL | Status: DC | PRN

## 2018-02-14 MED ORDER — OXYTOCIN BOLUS FROM INFUSION
500.0000 mL | Freq: Once | INTRAVENOUS | Status: AC
  Administered 2018-02-14: 500 mL via INTRAVENOUS

## 2018-02-14 MED ORDER — ZOLPIDEM TARTRATE 5 MG PO TABS: 5.0000 mg | ORAL_TABLET | Freq: Every evening | ORAL | Status: DC | PRN

## 2018-02-14 NOTE — MAU Note (Signed)
Ctx since 0900, every 3 min, 7/10  Having some spotting  Reports some LOF, unsure, reports it was brown  + FM this AM, unsure about now

## 2018-02-14 NOTE — Lactation Note (Signed)
This note was copied from a baby's chart. Lactation Consultation Note  Patient Name: Vanessa Talbert Nanurah Al-Qimlass IONGE'XToday's Date: 02/14/2018   P4, Baby 8 hours old.  Mother states she is formula feeding but allowing the baby to suckle at her breast to pacifier in between feedings. She states she knows how to hand express and does not need lactation services. Suggest she call if help is needed in the future.       Maternal Data    Feeding Feeding Type: Bottle Fed - Formula Nipple Type: Slow - flow  LATCH Score                   Interventions    Lactation Tools Discussed/Used     Consult Status      Vanessa Reed, Vanessa Reed 02/14/2018, 10:26 PM
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
## 2018-02-14 NOTE — Progress Notes (Signed)
MOB was referred for history of depression/anxiety. * Referral screened out by Clinical Social Worker because none of the following criteria appear to apply: ~ History of anxiety/depression during this pregnancy, or of post-partum depression following prior delivery. ~ Diagnosis of anxiety and/or depression within last 3 years OR * MOB's symptoms currently being treated with medication and/or therapy. Please contact the Clinical Social Worker if needs arise, by MOB request, or if MOB scores greater than 9/yes to question 10 on Edinburgh Postpartum Depression Screen.  Libbie Bartley, LCSW Clinical Social Worker  [employer]  [phone]  

## 2018-02-15 LAB — CBC
HCT: 36 % (ref 36.0–46.0)
Hemoglobin: 11.9 g/dL — ABNORMAL LOW (ref 12.0–15.0)
MCH: 32.6 pg (ref 26.0–34.0)
MCHC: 33.1 g/dL (ref 30.0–36.0)
MCV: 98.6 fL (ref 80.0–100.0)
NRBC: 0 % (ref 0.0–0.2)
PLATELETS: 187 10*3/uL (ref 150–400)
RBC: 3.65 MIL/uL — ABNORMAL LOW (ref 3.87–5.11)
RDW: 14.1 % (ref 11.5–15.5)
WBC: 12.1 10*3/uL — ABNORMAL HIGH (ref 4.0–10.5)

## 2018-02-15 NOTE — Progress Notes (Signed)
PPD#1 Pt without complaints. Would like to go home.  VS. IMP/ Stable Plan/Will discharge.

## 2018-02-15 NOTE — Discharge Summary (Signed)
Obstetric Discharge Summary Reason for Admission: onset of labor Prenatal Procedures: ultrasound Intrapartum Procedures: spontaneous vaginal delivery Postpartum Procedures: none Complications-Operative and Postpartum: 2nd degree perineal laceration Hemoglobin  Date Value Ref Range Status  02/15/2018 11.9 (L) 12.0 - 15.0 g/dL Final   HCT  Date Value Ref Range Status  02/15/2018 36.0 36.0 - 46.0 % Final    Physical Exam:  General: alert and cooperative Lochia: appropriate  Discharge Diagnoses: Term Pregnancy-delivered  Discharge Information: Date: 02/15/2018 Activity: pelvic rest Diet: routine Medications: PNV and Ibuprofen Condition: stable Instructions: refer to practice specific booklet Discharge to: home Follow-up Information    Vanessa Reed, [name], MD. Schedule an appointment as soon as possible for a visit in 1 month(s).   Specialty:  Obstetrics and Gynecology Contact information: [address] [place] [id number] [phone]           Newborn Data: Live born female  Birth Weight: 7 lb 12 oz (3515 g) APGAR: 9, 9  Newborn Delivery   Birth date/time:  02/14/2018 13:33:00 Delivery type:  Vaginal, Spontaneous     Home with mother.  Vanessa Reed 02/15/2018, 8:18 AM

## 2018-02-16 LAB — RPR: RPR Ser Ql: NONREACTIVE

## 2018-02-18 ENCOUNTER — Inpatient Hospital Stay (HOSPITAL_COMMUNITY): Admission: RE | Admit: 2018-02-18 | Payer: 59 | Source: Ambulatory Visit

## 2019-03-07 IMAGING — NM NM HEPATO W/GB/PHARM/[PERSON_NAME]
2 series · 12 of 12 positions shown · non-contrast
Comparison: None.

CLINICAL DATA: Epigastric pain, nausea vomiting.

EXAM:
NUCLEAR MEDICINE HEPATOBILIARY IMAGING WITH GALLBLADDER EF
TECHNIQUE: Sequential images of the abdomen were obtained [DATE] minutes
following intravenous administration of radiopharmaceutical. After
oral ingestion of Ensure, gallbladder ejection fraction was
determined. At 60 min, normal ejection fraction is greater than 33%.
RADIOPHARMACEUTICALS:  5.3 mCi Fc-WWm  Choletec IV

[he hepatobiliary · 3.10mm/px · 6 of 60 frames shown (1 of 2)]
[frame 6/60]
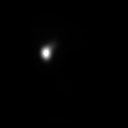
[frame 16/60]
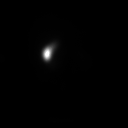
[frame 26/60]
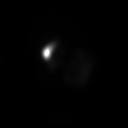
[frame 36/60]
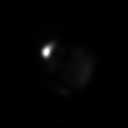
[frame 46/60]
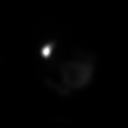
[frame 56/60]
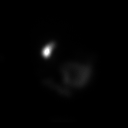

[he hepatobiliary · 3.10mm/px · 6 of 60 frames shown (2 of 2)]
[frame 6/60]
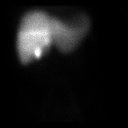
[frame 16/60]
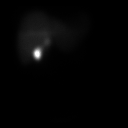
[frame 26/60]
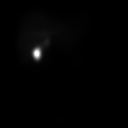
[frame 36/60]
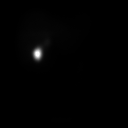
[frame 46/60]
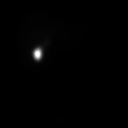
[frame 56/60]
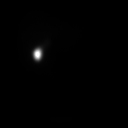

[12 of 12 positions shown; findings below may reference images not displayed]

FINDINGS: Prompt uptake and biliary excretion of activity by the liver is
seen. Gallbladder activity is visualized, consistent with patency of
cystic duct. Biliary activity passes into small bowel, consistent
with patent common bile duct.

Calculated gallbladder ejection fraction is 51%. (Normal gallbladder
ejection fraction with Ensure is greater than 33%.)
IMPRESSION: Normal hepatobiliary imaging.

## 2021-12-05 DIAGNOSIS — Z01419 Encounter for gynecological examination (general) (routine) without abnormal findings: Secondary | ICD-10-CM | POA: Diagnosis not present

## 2022-02-20 DIAGNOSIS — Z7185 Encounter for immunization safety counseling: Secondary | ICD-10-CM | POA: Diagnosis not present

## 2022-02-20 DIAGNOSIS — M7750 Other enthesopathy of unspecified foot: Secondary | ICD-10-CM | POA: Diagnosis not present

## 2022-02-20 DIAGNOSIS — E782 Mixed hyperlipidemia: Secondary | ICD-10-CM | POA: Diagnosis not present

## 2022-02-27 DIAGNOSIS — Z23 Encounter for immunization: Secondary | ICD-10-CM | POA: Diagnosis not present

## 2022-07-15 DIAGNOSIS — G4700 Insomnia, unspecified: Secondary | ICD-10-CM | POA: Diagnosis not present

## 2022-07-15 DIAGNOSIS — F411 Generalized anxiety disorder: Secondary | ICD-10-CM | POA: Diagnosis not present

## 2023-09-10 ENCOUNTER — Other Ambulatory Visit: Payer: Self-pay | Admitting: Family Medicine

## 2023-09-10 DIAGNOSIS — Z1231 Encounter for screening mammogram for malignant neoplasm of breast: Secondary | ICD-10-CM

## 2023-10-27 ENCOUNTER — Ambulatory Visit: Payer: Self-pay

## 2023-11-10 ENCOUNTER — Encounter: Payer: Self-pay | Admitting: Radiology

## 2023-11-10 ENCOUNTER — Ambulatory Visit
Admission: RE | Admit: 2023-11-10 | Discharge: 2023-11-10 | Disposition: A | Discharge status: Home / Self Care | Payer: Self-pay | Source: Ambulatory Visit | Attending: Family Medicine | Admitting: Family Medicine

## 2023-11-10 DIAGNOSIS — Z1231 Encounter for screening mammogram for malignant neoplasm of breast: Secondary | ICD-10-CM

## 2023-11-20 ENCOUNTER — Other Ambulatory Visit: Payer: Self-pay | Admitting: Family Medicine

## 2023-11-20 DIAGNOSIS — R928 Other abnormal and inconclusive findings on diagnostic imaging of breast: Secondary | ICD-10-CM

## 2023-11-30 ENCOUNTER — Inpatient Hospital Stay: Admission: RE | Admit: 2023-11-30 | Source: Ambulatory Visit

## 2023-11-30 ENCOUNTER — Other Ambulatory Visit

## 2023-11-30 ENCOUNTER — Encounter

## 2023-12-15 ENCOUNTER — Other Ambulatory Visit

## 2023-12-15 ENCOUNTER — Encounter
